# Patient Record
Sex: Male | Born: 1950 | Race: White | Hispanic: No | Smoking: Former smoker
Health system: Southern US, Community
[De-identification: ages and names within clinical notes are randomized; demographics above are authoritative.]

## PROBLEM LIST (undated history)

## (undated) DIAGNOSIS — M1990 Unspecified osteoarthritis, unspecified site: Secondary | ICD-10-CM

## (undated) DIAGNOSIS — K219 Gastro-esophageal reflux disease without esophagitis: Secondary | ICD-10-CM

## (undated) DIAGNOSIS — J302 Other seasonal allergic rhinitis: Secondary | ICD-10-CM

## (undated) DIAGNOSIS — F1910 Other psychoactive substance abuse, uncomplicated: Secondary | ICD-10-CM

## (undated) DIAGNOSIS — T7840XA Allergy, unspecified, initial encounter: Secondary | ICD-10-CM

## (undated) DIAGNOSIS — J449 Chronic obstructive pulmonary disease, unspecified: Secondary | ICD-10-CM

## (undated) DIAGNOSIS — L739 Follicular disorder, unspecified: Secondary | ICD-10-CM

## (undated) DIAGNOSIS — F329 Major depressive disorder, single episode, unspecified: Secondary | ICD-10-CM

## (undated) DIAGNOSIS — F32A Depression, unspecified: Secondary | ICD-10-CM

## (undated) HISTORY — PX: FINGER SURGERY: SHX640

## (undated) HISTORY — DX: Chronic obstructive pulmonary disease, unspecified: J44.9

## (undated) HISTORY — PX: VASECTOMY: SHX75

## (undated) HISTORY — DX: Gastro-esophageal reflux disease without esophagitis: K21.9

## (undated) HISTORY — PX: SINUSOTOMY: SHX291

## (undated) HISTORY — DX: Other seasonal allergic rhinitis: J30.2

## (undated) HISTORY — DX: Follicular disorder, unspecified: L73.9

## (undated) HISTORY — PX: POLYPECTOMY: SHX149

## (undated) HISTORY — DX: Depression, unspecified: F32.A

## (undated) HISTORY — DX: Unspecified osteoarthritis, unspecified site: M19.90

## (undated) HISTORY — DX: Allergy, unspecified, initial encounter: T78.40XA

## (undated) HISTORY — PX: COLONOSCOPY: SHX174

## (undated) HISTORY — PX: TONSILLECTOMY: SUR1361

## (undated) HISTORY — DX: Other psychoactive substance abuse, uncomplicated: F19.10

## (undated) HISTORY — DX: Major depressive disorder, single episode, unspecified: F32.9

---

## 1998-04-06 ENCOUNTER — Emergency Department (HOSPITAL_COMMUNITY): Admission: EM | Admit: 1998-04-06 | Discharge: 1998-04-06 | Payer: Self-pay | Admitting: Emergency Medicine

## 1999-03-31 ENCOUNTER — Emergency Department (HOSPITAL_COMMUNITY): Admission: EM | Admit: 1999-03-31 | Discharge: 1999-03-31 | Payer: Self-pay | Admitting: Emergency Medicine

## 1999-03-31 ENCOUNTER — Encounter: Payer: Self-pay | Admitting: *Deleted

## 2002-05-01 ENCOUNTER — Encounter: Payer: Self-pay | Admitting: Emergency Medicine

## 2002-05-01 ENCOUNTER — Inpatient Hospital Stay (HOSPITAL_COMMUNITY): Admission: EM | Admit: 2002-05-01 | Discharge: 2002-05-04 | Payer: Self-pay

## 2002-06-28 ENCOUNTER — Ambulatory Visit (HOSPITAL_COMMUNITY): Admission: RE | Admit: 2002-06-28 | Discharge: 2002-06-28 | Payer: Self-pay | Admitting: Neurological Surgery

## 2002-06-28 ENCOUNTER — Encounter: Payer: Self-pay | Admitting: Neurological Surgery

## 2002-07-17 ENCOUNTER — Encounter: Admission: RE | Admit: 2002-07-17 | Discharge: 2002-09-06 | Payer: Self-pay | Admitting: Neurological Surgery

## 2003-06-05 ENCOUNTER — Emergency Department (HOSPITAL_COMMUNITY): Admission: EM | Admit: 2003-06-05 | Discharge: 2003-06-05 | Payer: Self-pay | Admitting: Family Medicine

## 2004-06-21 ENCOUNTER — Inpatient Hospital Stay (HOSPITAL_COMMUNITY): Admission: EM | Admit: 2004-06-21 | Discharge: 2004-06-28 | Payer: Self-pay | Admitting: Emergency Medicine

## 2004-06-21 ENCOUNTER — Ambulatory Visit: Payer: Self-pay | Admitting: Psychiatry

## 2004-06-24 ENCOUNTER — Encounter (INDEPENDENT_AMBULATORY_CARE_PROVIDER_SITE_OTHER): Payer: Self-pay | Admitting: Interventional Cardiology

## 2004-06-26 ENCOUNTER — Ambulatory Visit: Payer: Self-pay | Admitting: Cardiology

## 2006-10-25 IMAGING — CR DG CHEST 1V PORT
1 series · 1 of 1 positions shown · non-contrast
Comparison: none

CLINICAL DATA: chest pain
 PORTABLE CHEST- 1 VIEW:

[view not recorded]
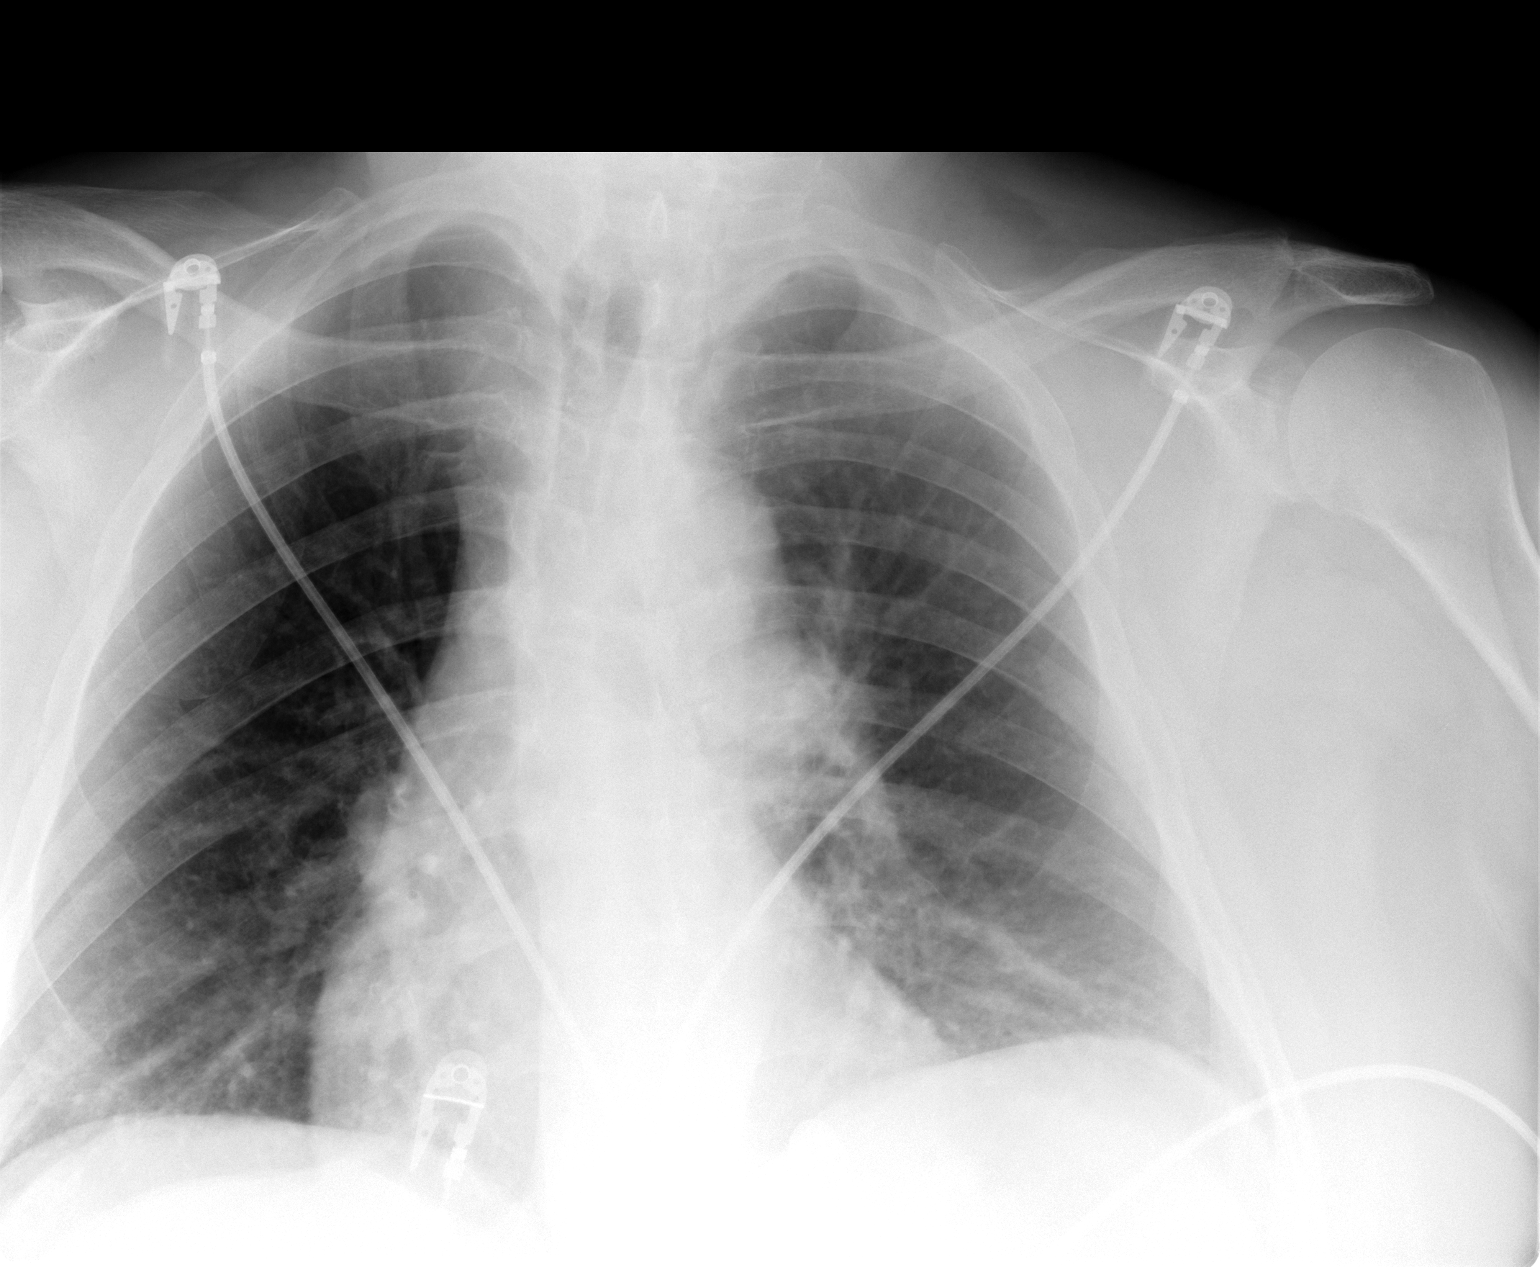

[1 of 1 positions shown; findings below may reference images not displayed]

FINDINGS: No focal lung opacities.  Heart size normal.  No pleural effusions or pneumothorax.
IMPRESSION: No acute cardiopulmonary abnormalities.

## 2008-03-27 ENCOUNTER — Ambulatory Visit (HOSPITAL_COMMUNITY): Admission: RE | Admit: 2008-03-27 | Discharge: 2008-03-27 | Payer: Self-pay | Admitting: Family Medicine

## 2009-08-22 ENCOUNTER — Encounter: Payer: Self-pay | Admitting: Family Medicine

## 2010-02-26 ENCOUNTER — Ambulatory Visit
Admission: RE | Admit: 2010-02-26 | Discharge: 2010-02-26 | Payer: Self-pay | Source: Home / Self Care | Attending: Family Medicine | Admitting: Family Medicine

## 2010-02-26 ENCOUNTER — Other Ambulatory Visit: Payer: Self-pay | Admitting: Family Medicine

## 2010-02-26 DIAGNOSIS — K219 Gastro-esophageal reflux disease without esophagitis: Secondary | ICD-10-CM | POA: Insufficient documentation

## 2010-02-26 DIAGNOSIS — J449 Chronic obstructive pulmonary disease, unspecified: Secondary | ICD-10-CM | POA: Insufficient documentation

## 2010-02-26 DIAGNOSIS — M1990 Unspecified osteoarthritis, unspecified site: Secondary | ICD-10-CM | POA: Insufficient documentation

## 2010-02-26 DIAGNOSIS — J4489 Other specified chronic obstructive pulmonary disease: Secondary | ICD-10-CM | POA: Insufficient documentation

## 2010-02-26 DIAGNOSIS — L738 Other specified follicular disorders: Secondary | ICD-10-CM | POA: Insufficient documentation

## 2010-02-26 LAB — CBC WITH DIFFERENTIAL/PLATELET
Basophils Absolute: 0.1 10*3/uL (ref 0.0–0.1)
Basophils Relative: 0.6 % (ref 0.0–3.0)
Eosinophils Absolute: 0.3 10*3/uL (ref 0.0–0.7)
Eosinophils Relative: 3.6 % (ref 0.0–5.0)
HCT: 41.4 % (ref 39.0–52.0)
Hemoglobin: 14.3 g/dL (ref 13.0–17.0)
Lymphocytes Relative: 28.8 % (ref 12.0–46.0)
Lymphs Abs: 2.8 10*3/uL (ref 0.7–4.0)
MCHC: 34.4 g/dL (ref 30.0–36.0)
MCV: 92.9 fl (ref 78.0–100.0)
Monocytes Absolute: 0.6 10*3/uL (ref 0.1–1.0)
Monocytes Relative: 6 % (ref 3.0–12.0)
Neutro Abs: 5.9 10*3/uL (ref 1.4–7.7)
Neutrophils Relative %: 61 % (ref 43.0–77.0)
Platelets: 257 10*3/uL (ref 150.0–400.0)
RBC: 4.46 Mil/uL (ref 4.22–5.81)
RDW: 12.7 % (ref 11.5–14.6)
WBC: 9.6 10*3/uL (ref 4.5–10.5)

## 2010-02-26 LAB — HEPATIC FUNCTION PANEL
ALT: 30 U/L (ref 0–53)
AST: 24 U/L (ref 0–37)
Albumin: 4.1 g/dL (ref 3.5–5.2)
Alkaline Phosphatase: 88 U/L (ref 39–117)
Bilirubin, Direct: 0.1 mg/dL (ref 0.0–0.3)
Total Bilirubin: 0.4 mg/dL (ref 0.3–1.2)
Total Protein: 7.6 g/dL (ref 6.0–8.3)

## 2010-02-26 LAB — LIPID PANEL
Cholesterol: 141 mg/dL (ref 0–200)
HDL: 25.2 mg/dL — ABNORMAL LOW (ref >39.00)
LDL Cholesterol: 94 mg/dL (ref 0–99)
Total CHOL/HDL Ratio: 6
Triglycerides: 107 mg/dL (ref 0.0–149.0)
VLDL: 21.4 mg/dL (ref 0.0–40.0)

## 2010-02-26 LAB — BASIC METABOLIC PANEL
BUN: 17 mg/dL (ref 6–23)
CO2: 29 mEq/L (ref 19–32)
Calcium: 9.6 mg/dL (ref 8.4–10.5)
Chloride: 104 mEq/L (ref 96–112)
Creatinine, Ser: 1 mg/dL (ref 0.4–1.5)
GFR: 85.71 mL/min (ref >60.00)
Glucose, Bld: 87 mg/dL (ref 70–99)
Potassium: 5 mEq/L (ref 3.5–5.1)
Sodium: 139 mEq/L (ref 135–145)

## 2010-02-26 LAB — CONVERTED CEMR LAB
Blood in Urine, dipstick: NEGATIVE
Ketones, urine, test strip: NEGATIVE
Protein, U semiquant: NEGATIVE
Urobilinogen, UA: 0.2

## 2010-02-26 LAB — TSH: TSH: 1.28 u[IU]/mL (ref 0.35–5.50)

## 2010-02-26 LAB — PSA: PSA: 1.04 ng/mL (ref 0.10–4.00)

## 2010-03-13 NOTE — Assessment & Plan Note (Signed)
Summary: BRAND NEW PT/TO EST/PT REQ MCR CPX/PT COMING IN FASTING/CJR   Vital Signs:  Patient profile:   60 year old male Height:      69.5 inches Weight:      331 pounds BMI:     48.35 O2 Sat:      97 % on Room air Temp:     98.0 degrees F oral Pulse rate:   115 / minute BP sitting:   140 / 80  (left arm) Cuff size:   regular  Vitals Entered By: Duard Brady LPN (February 26, 2010 8:59 AM)  Nutrition Counseling: Patient's BMI is greater than 25 and therefore counseled on weight management options.  O2 Flow:  Room air CC: new to establish Is Patient Diabetic? No   History of Present Illness: New pt to establish care.  Here for Medicare AWV:  1.   Risk factors based on Past M, S, F history:  Hx of GERD, osteoarthritis, ETOH abuse, and reported COPD.  FH mother gastric cancer and brother with esophageal ca.  No know colon cancer. No ETOH since 2008 and currently in ongoing support program.  Former smoker. 2.   Physical Activities: exercise 4-5 days per week. 3.   Depression/mood: No signif depression issues. 4.   Hearing: Hearing OK grossly and to whisper. 5.   ADL's: fully independent. 6.   Fall Risk: low risk 7.   Home Safety: no issues identiifed. 8.   Height, weight, &visual acuity:  Weight stable. Obese all his adult life. 9.   Counseling: needs to lose some weight.   10.   Labs ordered based on risk factors: lipid, hepatic , TSH, PSA, CBC, BMP 11.           Referral Coordination  colonoscopy 12.           Care Plan PVX last year and flu vaccine .  Tdap given.  Set up colonoscopy. 13.            Cognitive Assessment  NO short or long term impairment.  No impairment in judgement.                      Affect and mood are stable.  COPD meds reviewed  .  Past hx PVX and flu vaccine. Followed in [facility] for pulm meds. No recent increase in cough.  No hemoptysis.  GERD stable.  Takes OTC antacids for that.  Osteoarthrits, esp lower extremities.  Takes  Naproxen as needed. ED on Levitra.  That seems to be working well. Skin rash for several weeks.  Erythematous nodules back and face.  ? Occ pustular center. No fever.  Preventive Screening-Counseling & Management  Alcohol-Tobacco     Smoking Status: quit  Allergies (verified): 1)  ! Codeine  Past History:  Family History: Last updated: 02/26/2010 Mother ? gastric cancer Brother esophageal cancer Father alcoholism  Social History: Last updated: 02/26/2010 Divorced ONE CHILD Former Smoker 9/09 past hx ETOH. Hx cocine abuse.  Risk Factors: Smoking Status: quit (02/26/2010)  Past Medical History: Hx Alcoholism none since 2008 Osteoarthritis back COPD GERD  Past Surgical History: Tonsillectomy L sinus surgery polyps 1990s PMH-FH-SH reviewed for relevance  Family History: Mother ? gastric cancer Brother esophageal cancer Father alcoholism  Social History: Divorced ONE CHILD Former Smoker 9/09 past hx ETOH. Hx cocine abuse. Smoking Status:  quit  Review of Systems       The patient complains of weight gain.  The patient denies anorexia, fever, weight loss, vision loss, decreased hearing, hoarseness, chest pain, syncope, peripheral edema, prolonged cough, headaches, hemoptysis, abdominal pain, melena, hematochezia, severe indigestion/heartburn, hematuria, incontinence, muscle weakness, suspicious skin lesions, depression, and enlarged lymph nodes.    Physical Exam  General:  obese in NAD. Head:  normocephalic and atraumatic.   Eyes:  No corneal or conjunctival inflammation noted. EOMI. Perrla. Funduscopic exam benign, without hemorrhages, exudates or papilledema. Vision grossly normal. Ears:  External ear exam shows no significant lesions or deformities.  Otoscopic examination reveals clear canals, tympanic membranes are intact bilaterally without bulging, retraction, inflammation or discharge. Hearing is grossly normal bilaterally. Mouth:  Oral mucosa and  oropharynx without lesions or exudates.  Teeth in good repair. Neck:  No deformities, masses, or tenderness noted. Lungs:  Normal respiratory effort, chest expands symmetrically. Lungs are clear to auscultation, no crackles or wheezes. Heart:  normal rate and regular rhythm.   Abdomen:  soft, non-tender, normal bowel sounds, no masses, no guarding, no abdominal hernia, no hepatomegaly, and no splenomegaly.   Rectal:  No external abnormalities noted. Normal sphincter tone. No rectal masses or tenderness. Genitalia:  small cyst L scrotum-?spermatocele.  no testes masses. Prostate:  Prostate gland firm and smooth, no enlargement, nodularity, tenderness, mass, asymmetry or induration. Msk:  No deformity or scoliosis noted of thoracic or lumbar spine.   Extremities:  No clubbing, cyanosis, edema, or deformity noted with normal full range of motion of all joints.   Neurologic:  alert & oriented X3, cranial nerves II-XII intact, and strength normal in all extremities.   Skin:  L thigh.  erythema 6 by 8 cm area with some central mild induration and no fluctuance. Nontender. Several erythematous nodules upper back and face Cervical Nodes:  No lymphadenopathy noted Psych:  Oriented X3, good eye contact, not anxious appearing, and not depressed appearing.     Impression & Recommendations:  Problem # 1:  ROUTINE GENERAL MEDICAL EXAM@HEALTH  CARE FACL (ICD-V70.0) discussed need for weight loss.  Screening labs.  Tdap given.  Colonoscopy. Orders: UA Dipstick w/o Micro (automated)  (81003) Specimen Handling (21308) Venipuncture (65784) Gastroenterology Referral (GI) TLB-Lipid Panel (80061-LIPID) TLB-BMP (Basic Metabolic Panel-BMET) (80048-METABOL) TLB-CBC Platelet - w/Differential (85025-CBCD) TLB-Hepatic/Liver Function Pnl (80076-HEPATIC) TLB-TSH (Thyroid Stimulating Hormone) (84443-TSH) TLB-PSA (Prostate Specific Antigen) (84153-PSA) Medicare -1st Annual Wellness Visit [phone])  Problem # 2:   OSTEOARTHRITIS, MODERATE (ICD-715.90)  His updated medication list for this problem includes:    Aspir-low 81 Mg Tbec (Aspirin) ..... Qd    Naproxen 250 Mg Tabs (Naproxen) .Marland Kitchen... 2-3 qd  Problem # 3:  GERD (ICD-530.81)  His updated medication list for this problem includes:    Prilosec 20 Mg Cpdr (Omeprazole) .Marland Kitchen... Prn  Problem # 4:  COPD (ICD-496)  His updated medication list for this problem includes:    Advair Diskus 250-50 Mcg/dose Aepb (Fluticasone-salmeterol) .Marland Kitchen... 1 puffs bid    Combivent 18-103 Mcg/act Aero (Ipratropium-albuterol) .Marland Kitchen... 2 puffs two times a day and prn  Problem # 5:  FOLLICULITIS (ICD-704.8) doxycycline  Problem # 6:  CELLULITIS, THIGH (ICD-682.6)  His updated medication list for this problem includes:    Doxycycline Hyclate 100 Mg Caps (Doxycycline hyclate) ..... One by mouth two times a day for 10 days  Complete Medication List: 1)  Advair Diskus 250-50 Mcg/dose Aepb (Fluticasone-salmeterol) .Marland Kitchen.. 1 puffs bid 2)  Combivent 18-103 Mcg/act Aero (Ipratropium-albuterol) .... 2 puffs two times a day and prn 3)  Glucosamine-chondroitin 500-400 Mg Caps (Glucosamine-chondroitin) .... 2 by  mouth qd 4)  Multivitamins Tabs (Multiple vitamin) .... Qd 5)  Aspir-low 81 Mg Tbec (Aspirin) .... Qd 6)  Prilosec 20 Mg Cpdr (Omeprazole) .... Prn 7)  Naproxen 250 Mg Tabs (Naproxen) .... 2-3 qd 8)  Levitra 20 Mg Tabs (Vardenafil hcl) .... As directed 9)  Doxycycline Hyclate 100 Mg Caps (Doxycycline hyclate) .... One by mouth two times a day for 10 days  Other Orders: Tdap => 65yrs IM (32355) Admin 1st Vaccine (73220)  Patient Instructions: 1)  It is important that you exercise reguarly at least 20 minutes 5 times a week. If you develop chest pain, have severe difficulty breathing, or feel very tired, stop exercising immediately and seek medical attention.  2)  You need to lose weight. Consider a lower calorie diet and regular exercise.  3)  We will call you regarding  colonoscopy referral. 4)  Please schedule a follow-up appointment in 1 month.  Prescriptions: DOXYCYCLINE HYCLATE 100 MG CAPS (DOXYCYCLINE HYCLATE) one by mouth two times a day for 10 days  #20 x 0   Entered and Authorized by:   Evelena Peat MD   Signed by:   Evelena Peat MD on 02/26/2010   Method used:   Print then Give to Patient   RxID:   [id number]    Orders Added: 1)  UA Dipstick w/o Micro (automated)  [81003] 2)  Specimen Handling [99000] 3)  Venipuncture [17616] 4)  Tdap => 36yrs IM [90715] 5)  Admin 1st Vaccine [90471] 6)  Gastroenterology Referral [GI] 7)  TLB-Lipid Panel [80061-LIPID] 8)  TLB-BMP (Basic Metabolic Panel-BMET) [80048-METABOL] 9)  TLB-CBC Platelet - w/Differential [85025-CBCD] 10)  TLB-Hepatic/Liver Function Pnl [80076-HEPATIC] 11)  TLB-TSH (Thyroid Stimulating Hormone) [84443-TSH] 12)  TLB-PSA (Prostate Specific Antigen) [07371-GGY] 13)  Medicare -1st Annual Wellness Visit [G0438] 14)  New Patient Level III [69485]   Immunizations Administered:  Tetanus Vaccine:    Vaccine Type: Tdap    Site: left deltoid    Mfr: GlaxoSmithKline    Dose: 0.5 ml    Route: IM    Given by: Duard Brady LPN    Exp. Date: 11/29/2011    Lot #: [id number]    VIS given: 12/28/07 version given February 26, 2010.    Physician counseled: yes   Immunizations Administered:  Tetanus Vaccine:    Vaccine Type: Tdap    Site: left deltoid    Mfr: GlaxoSmithKline    Dose: 0.5 ml    Route: IM    Given by: Duard Brady LPN    Exp. Date: 11/29/2011    Lot #: [id number]    VIS given: 12/28/07 version given February 26, 2010.    Physician counseled: yes   Laboratory Results   Urine Tests  Date/Time Recieved: February 26, 2010 2:55 PM  Date/Time Reported: February 26, 2010 2:55 PM   Routine Urinalysis   Color: yellow Appearance: Clear Glucose: negative   (Normal Range: Negative) Bilirubin: negative   (Normal Range: Negative) Ketone: negative    (Normal Range: Negative) Spec. Gravity: 1.015   (Normal Range: 1.003-1.035) Blood: negative   (Normal Range: Negative) pH: 6.5   (Normal Range: 5.0-8.0) Protein: negative   (Normal Range: Negative) Urobilinogen: 0.2   (Normal Range: 0-1) Nitrite: negative   (Normal Range: Negative) Leukocyte Esterace: negative   (Normal Range: Negative)    Comments: Wynona Canes, CMA  February 26, 2010 2:55 PM

## 2010-03-14 NOTE — Letter (Signed)
Summary: Records from [facility] Department 2009 - 2011  Records from [facility] Department 2009 - 2011   Imported By: Maryln Gottron 12/24/2009 15:18:07  _____________________________________________________________________  External Attachment:    Type:   Image     Comment:   External Document

## 2010-04-17 ENCOUNTER — Other Ambulatory Visit: Payer: Self-pay | Admitting: Internal Medicine

## 2010-04-25 ENCOUNTER — Encounter: Payer: Self-pay | Admitting: *Deleted

## 2010-04-28 ENCOUNTER — Encounter: Payer: Self-pay | Admitting: Internal Medicine

## 2010-05-01 ENCOUNTER — Other Ambulatory Visit: Payer: Self-pay | Admitting: Internal Medicine

## 2010-05-08 NOTE — Miscellaneous (Signed)
Summary: [facility]  Clinical Lists Changes  Medications: Added new medication of MOVIPREP 100 GM  SOLR (PEG-KCL-NACL-NASULF-NA ASC-C) As per prep instructions. - Signed Rx of MOVIPREP 100 GM  SOLR (PEG-KCL-NACL-NASULF-NA ASC-C) As per prep instructions.;  #1 x 0;  Signed;  Entered by: Ezra Sites RN;  Authorized by: Iva Boop MD, [facility];  Method used: Electronically to [facility]*, [address] [place], [place]  [address], Ph: [phone], Fax: Allergies: Changed allergy or adverse reaction from CODEINE to CODEINE    Prescriptions: MOVIPREP 100 GM  SOLR (PEG-KCL-NACL-NASULF-NA ASC-C) As per prep instructions.  #1 x 0   Entered by:   Ezra Sites RN   Authorized by:   Iva Boop MD, [facility]   Signed by:   Ezra Sites RN on 04/28/2010   Method used:   Electronically to        [facility]* (retail)       [address]       [place], [place]  [address]       Ph: [phone]       Fax:    RxID:   [id number]

## 2010-05-08 NOTE — Letter (Signed)
Summary: [facility] Instructions  [facility]  [address] [place], [place] [address]   Phone: [phone]  Fax: [phone]       Edgar Perez    November 24, 1950    MRN: [id number]        Procedure Day /Date:  Monday 05/12/2010     Arrival Time: 2:00 pm     Procedure Time: 3:00 pm     Location of Procedure:                    _ x_  [facility] (4th Floor)   PREPARATION FOR COLONOSCOPY WITH MOVIPREP   Starting 5 days prior to your procedure Wednesday 3/28 do not eat nuts, seeds, popcorn, corn, beans, peas,  salads, or any raw vegetables.  Do not take any fiber supplements (e.g. Metamucil, Citrucel, and Benefiber).  THE DAY BEFORE YOUR PROCEDURE         DATE: Sunday 4/1  1.  Drink clear liquids the entire day-NO SOLID FOOD  2.  Do not drink anything colored red or purple.  Avoid juices with pulp.  No orange juice.  3.  Drink at least 64 oz. (8 glasses) of fluid/clear liquids during the day to prevent dehydration and help the prep work efficiently.  CLEAR LIQUIDS INCLUDE: Water Jello Ice Popsicles Tea (sugar ok, no milk/cream) Powdered fruit flavored drinks Coffee (sugar ok, no milk/cream) Gatorade Juice: apple, white grape, white cranberry  Lemonade Clear bullion, consomm, broth Carbonated beverages (any kind) Strained chicken noodle soup Hard Candy                             4.  In the morning, mix first dose of MoviPrep solution:    Empty 1 Pouch A and 1 Pouch B into the disposable container    Add lukewarm drinking water to the top line of the container. Mix to dissolve    Refrigerate (mixed solution should be used within 24 hrs)  5.  Begin drinking the prep at 5:00 p.m. The MoviPrep container is divided by 4 marks.   Every 15 minutes drink the solution down to the next mark (approximately 8 oz) until the full liter is complete.   6.  Follow completed prep with 16 oz of clear liquid of your choice (Nothing red or purple).   Continue to drink clear liquids until bedtime.  7.  Before going to bed, mix second dose of MoviPrep solution:    Empty 1 Pouch A and 1 Pouch B into the disposable container    Add lukewarm drinking water to the top line of the container. Mix to dissolve    Refrigerate  THE DAY OF YOUR PROCEDURE      DATE: Monday 4/2  Beginning at 10:00 a.m. (5 hours before procedure):         1. Every 15 minutes, drink the solution down to the next mark (approx 8 oz) until the full liter is complete.  2. Follow completed prep with 16 oz. of clear liquid of your choice.    3. You may drink clear liquids until 1:00 pm (2 HOURS BEFORE PROCEDURE).   MEDICATION INSTRUCTIONS  Unless otherwise instructed, you should take regular prescription medications with a small sip of water   as early as possible the morning of your procedure.           OTHER INSTRUCTIONS  You will need a responsible adult at  least 58 years of age to accompany you and drive you home.   This person must remain in the waiting room during your procedure.  Wear loose fitting clothing that is easily removed.  Leave jewelry and other valuables at home.  However, you may wish to bring a book to read or  an iPod/MP3 player to listen to music as you wait for your procedure to start.  Remove all body piercing jewelry and leave at home.  Total time from sign-in until discharge is approximately 2-3 hours.  You should go home directly after your procedure and rest.  You can resume normal activities the  day after your procedure.  The day of your procedure you should not:   Drive   Make legal decisions   Operate machinery   Drink alcohol   Return to work  You will receive specific instructions about eating, activities and medications before you leave.    The above instructions have been reviewed and explained to me by  Ezra Sites RN  April 28, 2010 1:51 PM      I fully understand and can verbalize these  instructions _____________________________ Date _________

## 2010-05-12 ENCOUNTER — Ambulatory Visit (AMBULATORY_SURGERY_CENTER): Payer: Medicare Other | Admitting: Internal Medicine

## 2010-05-12 ENCOUNTER — Encounter: Payer: Self-pay | Admitting: Internal Medicine

## 2010-05-12 VITALS — BP 120/73 | HR 80 | Temp 99.0°F | Resp 16 | Ht 71.0 in | Wt 330.0 lb

## 2010-05-12 DIAGNOSIS — Z1211 Encounter for screening for malignant neoplasm of colon: Secondary | ICD-10-CM

## 2010-05-12 DIAGNOSIS — K620 Anal polyp: Secondary | ICD-10-CM

## 2010-05-12 DIAGNOSIS — D126 Benign neoplasm of colon, unspecified: Secondary | ICD-10-CM

## 2010-05-12 DIAGNOSIS — K5730 Diverticulosis of large intestine without perforation or abscess without bleeding: Secondary | ICD-10-CM

## 2010-05-12 MED ORDER — ASPIRIN 81 MG PO TABS: 81.0000 mg | ORAL_TABLET | Freq: Every day | ORAL | Status: AC

## 2010-05-12 MED ORDER — NAPROXEN 250 MG PO TABS: 250.0000 mg | ORAL_TABLET | Freq: Every day | ORAL | Status: DC

## 2010-05-12 NOTE — Patient Instructions (Addendum)
Do not use your aspirin or Naproxen or other ant-inflammatories or aspirin medications until 05/26/2010. See medication list below also. Handouts for polyps, hemorrhoids, diverticulosis, high fiber diet given.

## 2010-05-13 ENCOUNTER — Telehealth: Payer: Self-pay | Admitting: *Deleted

## 2010-05-13 NOTE — Telephone Encounter (Signed)
No name on answering machine, no message left

## 2010-05-14 ENCOUNTER — Observation Stay (HOSPITAL_COMMUNITY)
Admission: EM | Admit: 2010-05-14 | Discharge: 2010-05-16 | Disposition: A | Discharge status: Home / Self Care | Payer: Medicare Other | Attending: Gastroenterology | Admitting: Gastroenterology

## 2010-05-14 ENCOUNTER — Observation Stay (HOSPITAL_COMMUNITY): Admission: AD | Admit: 2010-05-14 | Payer: Medicare Other | Source: Ambulatory Visit | Admitting: Gastroenterology

## 2010-05-14 ENCOUNTER — Telehealth: Payer: Self-pay | Admitting: Internal Medicine

## 2010-05-14 ENCOUNTER — Encounter: Payer: Self-pay | Admitting: *Deleted

## 2010-05-14 DIAGNOSIS — IMO0002 Reserved for concepts with insufficient information to code with codable children: Secondary | ICD-10-CM | POA: Insufficient documentation

## 2010-05-14 DIAGNOSIS — K5730 Diverticulosis of large intestine without perforation or abscess without bleeding: Secondary | ICD-10-CM | POA: Insufficient documentation

## 2010-05-14 DIAGNOSIS — D500 Iron deficiency anemia secondary to blood loss (chronic): Secondary | ICD-10-CM | POA: Insufficient documentation

## 2010-05-14 DIAGNOSIS — Z79899 Other long term (current) drug therapy: Secondary | ICD-10-CM | POA: Insufficient documentation

## 2010-05-14 DIAGNOSIS — Y849 Medical procedure, unspecified as the cause of abnormal reaction of the patient, or of later complication, without mention of misadventure at the time of the procedure: Secondary | ICD-10-CM | POA: Insufficient documentation

## 2010-05-14 DIAGNOSIS — K219 Gastro-esophageal reflux disease without esophagitis: Secondary | ICD-10-CM | POA: Insufficient documentation

## 2010-05-14 DIAGNOSIS — Z7982 Long term (current) use of aspirin: Secondary | ICD-10-CM | POA: Insufficient documentation

## 2010-05-14 DIAGNOSIS — J4489 Other specified chronic obstructive pulmonary disease: Secondary | ICD-10-CM | POA: Insufficient documentation

## 2010-05-14 DIAGNOSIS — K633 Ulcer of intestine: Principal | ICD-10-CM | POA: Insufficient documentation

## 2010-05-14 DIAGNOSIS — D62 Acute posthemorrhagic anemia: Secondary | ICD-10-CM

## 2010-05-14 DIAGNOSIS — M1990 Unspecified osteoarthritis, unspecified site: Secondary | ICD-10-CM | POA: Insufficient documentation

## 2010-05-14 DIAGNOSIS — D126 Benign neoplasm of colon, unspecified: Secondary | ICD-10-CM | POA: Insufficient documentation

## 2010-05-14 DIAGNOSIS — Z01812 Encounter for preprocedural laboratory examination: Secondary | ICD-10-CM | POA: Insufficient documentation

## 2010-05-14 DIAGNOSIS — K921 Melena: Secondary | ICD-10-CM

## 2010-05-14 DIAGNOSIS — J449 Chronic obstructive pulmonary disease, unspecified: Secondary | ICD-10-CM | POA: Insufficient documentation

## 2010-05-14 LAB — PROTIME-INR
INR: 1.12 (ref 0.00–1.49)
Prothrombin Time: 14.6 seconds (ref 11.6–15.2)

## 2010-05-14 LAB — BASIC METABOLIC PANEL
BUN: 14 mg/dL (ref 6–23)
Chloride: 105 mEq/L (ref 96–112)
GFR calc Af Amer: >60 mL/min (ref >60)
GFR calc non Af Amer: >60 mL/min (ref >60)
Potassium: 3.9 mEq/L (ref 3.5–5.1)
Sodium: 135 mEq/L (ref 135–145)

## 2010-05-14 LAB — DIFFERENTIAL
Basophils Relative: 1 % (ref 0–1)
Eosinophils Relative: 4 % (ref 0–5)
Lymphs Abs: 2.7 10*3/uL (ref 0.7–4.0)
Monocytes Absolute: 0.6 10*3/uL (ref 0.1–1.0)
Neutro Abs: 4.3 10*3/uL (ref 1.7–7.7)

## 2010-05-14 LAB — CBC
Hemoglobin: 9.8 g/dL — ABNORMAL LOW (ref 13.0–17.0)
RDW: 12.8 % (ref 11.5–15.5)
WBC: 7.9 10*3/uL (ref 4.0–10.5)

## 2010-05-14 NOTE — Telephone Encounter (Signed)
Called [facility] ED it confirm Patient in ED.   He was in triage having bloodwork done while waiting for room in ED.  Hospital MD to see patient per Dr. Leone Payor.  Called Amy [place], daughter, and reported this to her.  She asked that I leave her name and number with the [facility] ED desk.  This has been done.

## 2010-05-14 NOTE — Telephone Encounter (Signed)
Pt states that he had a colon Monday and he has been having liquid, bloody stools. States that they have been dark, deep wine color. His last BM at 6:45am was bright red. Spoke with Dr. Leone Payor and per Dr. Leone Payor pt needs to go to the ER to be evaluated. Amy [place] PA aware and knows pt is on the way to [facility] ER.

## 2010-05-14 NOTE — Telephone Encounter (Signed)
This encounter was created in error - please disregard.

## 2010-05-15 DIAGNOSIS — Z86010 Personal history of colonic polyps: Secondary | ICD-10-CM

## 2010-05-15 DIAGNOSIS — K5730 Diverticulosis of large intestine without perforation or abscess without bleeding: Secondary | ICD-10-CM

## 2010-05-15 DIAGNOSIS — K633 Ulcer of intestine: Secondary | ICD-10-CM

## 2010-05-15 LAB — HEMOGLOBIN AND HEMATOCRIT, BLOOD
HCT: 26.3 % — ABNORMAL LOW (ref 39.0–52.0)
HCT: 28.8 % — ABNORMAL LOW (ref 39.0–52.0)
Hemoglobin: 8.9 g/dL — ABNORMAL LOW (ref 13.0–17.0)
Hemoglobin: 9.3 g/dL — ABNORMAL LOW (ref 13.0–17.0)
Hemoglobin: 9.5 g/dL — ABNORMAL LOW (ref 13.0–17.0)

## 2010-05-15 LAB — CBC
Platelets: 158 10*3/uL (ref 150–400)
RDW: 12.8 % (ref 11.5–15.5)

## 2010-05-15 LAB — COMPREHENSIVE METABOLIC PANEL
Alkaline Phosphatase: 61 U/L (ref 39–117)
CO2: 27 mEq/L (ref 19–32)
Chloride: 109 mEq/L (ref 96–112)
GFR calc Af Amer: >60 mL/min (ref >60)
GFR calc non Af Amer: >60 mL/min (ref >60)
Potassium: 4.2 mEq/L (ref 3.5–5.1)
Sodium: 141 mEq/L (ref 135–145)
Total Bilirubin: 0.5 mg/dL (ref 0.3–1.2)
Total Protein: 5.9 g/dL — ABNORMAL LOW (ref 6.0–8.3)

## 2010-05-15 LAB — MRSA PCR SCREENING: MRSA by PCR: NEGATIVE

## 2010-05-15 LAB — ABO/RH: ABO/RH(D): O POS

## 2010-05-16 LAB — CROSSMATCH
ABO/RH(D): O POS
Antibody Screen: NEGATIVE
Unit division: 0
Unit division: 0

## 2010-05-19 ENCOUNTER — Telehealth: Payer: Self-pay | Admitting: Family Medicine

## 2010-05-19 ENCOUNTER — Telehealth: Payer: Self-pay

## 2010-05-19 ENCOUNTER — Other Ambulatory Visit (INDEPENDENT_AMBULATORY_CARE_PROVIDER_SITE_OTHER): Payer: Medicare Other

## 2010-05-19 DIAGNOSIS — K625 Hemorrhage of anus and rectum: Secondary | ICD-10-CM

## 2010-05-19 LAB — CBC WITH DIFFERENTIAL/PLATELET
Basophils Absolute: 0 10*3/uL (ref 0.0–0.1)
Eosinophils Absolute: 0.2 10*3/uL (ref 0.0–0.7)
Hemoglobin: 9.7 g/dL — ABNORMAL LOW (ref 13.0–17.0)
Lymphocytes Relative: 28.8 % (ref 12.0–46.0)
Lymphs Abs: 2.1 10*3/uL (ref 0.7–4.0)
MCHC: 35.1 g/dL (ref 30.0–36.0)
Neutro Abs: 4.4 10*3/uL (ref 1.4–7.7)
Platelets: 225 10*3/uL (ref 150.0–400.0)
RDW: 13.6 % (ref 11.5–14.6)

## 2010-05-19 NOTE — Telephone Encounter (Signed)
Pt informed and will have him come in for fasting labs and OV

## 2010-05-19 NOTE — Telephone Encounter (Signed)
Please advise 

## 2010-05-19 NOTE — Telephone Encounter (Signed)
Patient was discharged from the hospital for a post polypectomy bleed on 05/15/10.  He is c/o HA, dizziness, light headed and has to steady himself after standing up.  He denies any more bloody stool, abdominal pain, fever, nausea or vomiting.  He is tolerating a soft diet and his bowel habits have returned to normal.  He does report black stool, but was also started on iron last week.

## 2010-05-19 NOTE — Telephone Encounter (Signed)
I spoke with Dr Arlyce Dice patient will come in for a stat CBC today.  Patient is aware and will come this am for labs.  Patient also requests to change to Dr Arlyce Dice, "I felt very comfortable with him while I was in the hospital".  Dr Leone Payor do you approve of the change

## 2010-05-19 NOTE — Telephone Encounter (Signed)
Pt called and colonoscopy done by [facility]. Pt has some complicatons with bleeding after polyp removal. Pt ended up having to go to ER and was told that he was a diabetic while pt was in hospital, and to sch a fup with Dr Caryl Never and get labs drawn for bmet (to check sugars) and cbc.  Pt is req that Dr Caryl Never review his chart and advise.

## 2010-05-19 NOTE — Telephone Encounter (Signed)
I suggest follow up and we will plan to get fasting glucose here.

## 2010-05-19 NOTE — Progress Notes (Signed)
Patient advised of Dr Marzetta Board recommendations.

## 2010-05-20 ENCOUNTER — Ambulatory Visit (INDEPENDENT_AMBULATORY_CARE_PROVIDER_SITE_OTHER): Payer: Medicare Other | Admitting: Family Medicine

## 2010-05-20 ENCOUNTER — Encounter: Payer: Self-pay | Admitting: Family Medicine

## 2010-05-20 VITALS — BP 140/78 | HR 72 | Temp 98.6°F | Resp 12 | Ht 69.5 in | Wt 326.0 lb

## 2010-05-20 DIAGNOSIS — M545 Low back pain: Secondary | ICD-10-CM

## 2010-05-20 DIAGNOSIS — R42 Dizziness and giddiness: Secondary | ICD-10-CM

## 2010-05-20 DIAGNOSIS — D649 Anemia, unspecified: Secondary | ICD-10-CM

## 2010-05-20 DIAGNOSIS — R7309 Other abnormal glucose: Secondary | ICD-10-CM

## 2010-05-20 MED ORDER — TRAMADOL HCL 50 MG PO TABS: 50.0000 mg | ORAL_TABLET | Freq: Four times a day (QID) | ORAL | Status: AC | PRN

## 2010-05-20 NOTE — Progress Notes (Signed)
  Subjective:    Patient ID: Edgar Perez, male    DOB: 08/08/1950, 60 y.o.   MRN: [id number]  HPI Patient seen for followup. Recent colonoscopy April 2. Had several polyps removed. Had complication of some ongoing bleeding and was readmitted and had a repeat colonoscopy performed. Hemoglobin has remained stable in the 9 range. Just had repeat hemoglobin yesterday. Is here today regarding elevated blood sugar. Back in January when he had complete physical had fasting blood sugar of 87. Had one reading of 161 in hospital but was receiving some IV fluids (?dextrose containing) and also not clear if this was a fasting blood sugar. He has no history of diabetes. Prior to his recent colonoscopy exercising fairly regularly.  He does have some lightheadedness, dizziness, and occasional headaches since his colonoscopy. We explained this may be related to his anemia. He has not had any syncope. Good fluid intake. Appetite is fair. No further hematochezia  He has some chronic low back pain and has taken Naproxen in past.  We have recommended no NSAIDS at this time and wrote for limited Tramadol to use for back pain.  Review of Systems  Constitutional: Negative for fever, chills, appetite change and unexpected weight change.  Respiratory: Negative for cough and shortness of breath.   Cardiovascular: Negative for chest pain, palpitations and leg swelling.  Gastrointestinal: Negative for nausea, vomiting, abdominal pain, diarrhea and constipation.  Genitourinary: Negative for dysuria.  Neurological: Positive for dizziness and headaches. Negative for seizures, syncope and weakness.  Psychiatric/Behavioral: Negative for dysphoric mood.       Objective:   Physical Exam  Constitutional: He is oriented to person, place, and time. He appears well-developed and well-nourished. No distress.  HENT:  Mouth/Throat: Oropharynx is clear and moist. No oropharyngeal exudate.  Eyes: Pupils are equal, round, and  reactive to light.  Cardiovascular: Normal rate, regular rhythm and normal heart sounds.   No murmur heard. Pulmonary/Chest: Effort normal and breath sounds normal. No respiratory distress. He has no wheezes. He has no rales.  Abdominal: Soft. There is no tenderness. There is no rebound and no guarding.  Musculoskeletal: He exhibits no edema.  Neurological: He is alert and oriented to person, place, and time. No cranial nerve deficit.          Assessment & Plan:  #1 elevated blood sugar during recent hospitalization. Reassess fasting blood sugar today #2 recent hematochezia following polypectomy with anemia which is stable #3 mild dizziness probably related to anemia.

## 2010-05-20 NOTE — Telephone Encounter (Signed)
That is ok with me Let him know that polyps benign He will need a follow up colon in several months per dr Arlyce Dice to recheck and remove residual cecal polyp No letter  i do not think i received his path in myinbox but found it!

## 2010-05-20 NOTE — Patient Instructions (Signed)
Continue with plenty of fluids and iron supplementation. Continue to work on weight loss.

## 2010-05-21 NOTE — Telephone Encounter (Signed)
Blood count is stable.  I would prefer that he follow with Dr. Leone Payor. If not, I am only too except in.

## 2010-05-21 NOTE — Telephone Encounter (Signed)
Recall entered for 11/2010 for Dr Arlyce Dice as pt prefers to switch to Dr Arlyce Dice. He is notified of recall date.

## 2010-05-21 NOTE — Telephone Encounter (Signed)
Patient advised of all the above.     Dr Arlyce Dice when do you want me to place the recall for?

## 2010-05-21 NOTE — Telephone Encounter (Signed)
Followup colonoscopy with possible ERBE in 6 months

## 2010-05-23 NOTE — Progress Notes (Signed)
Quick Note:  As per other documentation patient informed and to have repeat colonoscopy with Dr. Arlyce Dice Oct 2012 ______

## 2010-05-26 ENCOUNTER — Ambulatory Visit: Payer: Medicare Other | Admitting: Family Medicine

## 2010-06-11 NOTE — Discharge Summary (Signed)
Edgar Perez, Edgar Perez              ACCOUNT NO.:  [id number]  MEDICAL RECORD NO.:  [id number]           PATIENT TYPE:  O  LOCATION:  1304                         FACILITY:  [facility]  PHYSICIAN:  Barbette Hair. Arlyce Dice, MD,FACGDATE OF BIRTH:  12/10/1950  DATE OF ADMISSION:  05/14/2010 DATE OF DISCHARGE:  05/16/2010                              DISCHARGE SUMMARY   ADMITTING DIAGNOSES: 1. Lower gastrointestinal bleed.  Suspect bleeding from recent     polypectomy site in the colon. 2. Colonoscopy on May 12, 2010 at [facility].  Five     polyps were removed; one larger polyp in the cecum was piecemeal     removed with snare cautery.  Additional polyps removed from the     ascending, sigmoid, and rectum.  Severe diverticulosis also noted     within the sigmoid colon. 3. Obesity. 4. Chronic obstructive pulmonary disease. 5. Osteoarthritis.  Naprosyn has been on     hold since before the colonoscopy.  DISCHARGE DIAGNOSES: 1. Post polypectomy bleed.  Status post repeat colonoscopy May 15, 2010.  Ulcers found at sites of polypectomies in     the cecum and descending colon.  The cecal post polypectomy ulcer     was treated with injection of epinephrine.  No active bleeding was     seen during the colonoscopy. There was only a scant amount of blood     seen following the injection of epinephrine. 2. Acute blood loss anemia.  Hemoglobin nadir of 8.6 on May 15, 2010.     Hemoglobin 9.6 at discharge.  Baseline hemoglobin from February 26, 2010 is 14.3. 3. Hyperglycemia with a blood sugar reading of 161 on May 15, 2010.     Note that serum glucose was normal on routine labs on February 26, 2010. 4. Obesity. 5. Osteoarthritis.  Again, Naprosyn was on hold prior to and following     his 60 first colonoscopy and also plan to hold Naprosyn for another     couple of weeks after this discharge.  PROCEDURES:  Colonoscopy by Dr. Arlyce Dice.  Description above.  CONSULTATIONS:   None.  BRIEF HISTORY:  Edgar Perez is a 60 year old gentleman who had a screening colonoscopy by Dr. Leone Payor on May 12, 2010.  Five polyps in total were removed; the largest of these located in the cecum was 15-20 mm and was removed piecemeal with snare and cautery.  The patient started passing dark blood the evening following the colonoscopy.  The [facility] called the patient but did not leave a message. The patient did not call [facility] to report these bloody black liquid stools and they persisted Tuesday and Wednesday.  By Wednesday morning the blood looked a little bit more bright red and he did call the office that afternoon and was sent to the emergency room for evaluation.  He was not having much in the way of abdominal pain.  He just felt bloated.  He had mild dizziness with walking. He had kept both his aspirin and Naprosyn on hold pre and post polypectomy.  In the emergency room the patient was evaluated and admitted for a presumed post polypectomy bleed to the [facility] GI service, Dr. Melvia Heaps attending.  LABORATORY DATA:  The admission CBC with a hemoglobin of 9.8, hematocrit of 29.3, MCV of 90.2, platelets of 188 and white blood cell count of 7.9.  Hemoglobin, hematocrit nadir of 8.6 and 26 on May 15, 2010.  On May 16, 2010, the morning of discharge, the hemoglobin was 9.6 and hematocrit was 28.6.  PT 14.6, INR 1.12.  Sodium 141, potassium 4.2. Chloride 109, glucose 27.  BUN 10, creatinine 0.8.  Glucose reading range was 87-161.  Albumin was 3.1.  Total protein 5.9, calcium 8.2. Transaminases, alkaline phosphatase and total bilirubin were within normal limits.  HOSPITAL COURSE:  The patient was admitted to [facility] for close observation and serial CBCs.  His hemoglobin did drift, associated with ongoing lower GI bleeding.  However, he did not require any transfusions following that low hemoglobin of 8.6.  It steadily rose  up into the 9 range.  The patient had bleeding leading up to the colonoscopy prep and during the colonoscopy prep.  However, at the time of the colonoscopy there was no fresh blood in the colon nor was there any active oozing from any of the polypectomy-associated ulcers which were seen on colonoscopy.  One polypectomy site at the cecum was injected.  The patient was kept overnight following that colonoscopy.  Diet was advanced.  He was stable for discharge home the morning of May 16, 2010.  He had even had a small ball-like volume of brown stool the morning of May 16, 2010.  He was not feeling dizzy.  He had a fair amount of questions, all of which were answered.  Pathology does not yet have the results of the pathology from the patient's polypectomies.  DISCHARGE DIET:  Regular, although with his increased glucose, he was advised to avoid sweets and sugar-sweetened drinks.  As for followup, within the next 2-4 weeks he should be seen by Dr. Caryl Never to have both a CBC as well as to have a BMET drawn to ascertain whether his anemia has further improved as well as whether or not the hyperglycemia observed as an inpatient is a persistent versus a fleeting problem.  DISCHARGE MEDICATIONS:  Acetaminophen 325 mg 3 tablets by mouth daily, Advair Diskus inhaler 250/50 one puff inhaled twice daily, Combivent inhaler 2-3 puffs inhaled daily, glucosamine 2 tablets by mouth daily, multivitamin 1 tablet daily, ranitidine 75 mg daily.     Jennye Moccasin, PA-C   ______________________________ Barbette Hair. Arlyce Dice, MD,FACG    SG/MEDQ  D:  05/16/2010  T:  05/16/2010  Job:  [id number]  cc:   Evelena Peat, M.D.  Barbette Hair. Arlyce Dice, MD,FACG [address] [place] [address]  Iva Boop, MD,FACG [facility] [address] [place], [place] [address]  Electronically Signed by Jennye Moccasin PA-C on 05/19/2010 01:41:05 PM Electronically Signed by Melvia Heaps MDFACG on  06/11/2010 02:44:17 PM

## 2010-06-11 NOTE — H&P (Signed)
Edgar Perez, Edgar Perez              ACCOUNT NO.:  [id number]  MEDICAL RECORD NO.:  [id number]           PATIENT TYPE:  I  LOCATION:  1304                         FACILITY:  [facility]  PHYSICIAN:  Barbette Hair. Arlyce Dice, MD,FACGDATE OF BIRTH:  [birth year]  DATE OF ADMISSION:  05/14/2010 DATE OF DISCHARGE:                             HISTORY & PHYSICAL   PROBLEMS:  Acute rectal bleeding.  HISTORY:  Edgar Perez is a pleasant 60 year old white male, primary patient of Dr. Caryl Never, who was referred to Dr. Leone Payor for a screening colonoscopy.  He underwent colonoscopy on May 12, 2010 at the [facility] and was found to have 5 polyps.  The largest of these was 15-20 mm located in the cecum and was removed piecemeal with snare and cautery. He also had a second polyp in the ascending colon, approximately 12 mm, which was also removed piecemeal and cautery and then 3 smaller polyps in the left colon removed with cold snare. The patient says he started passing dark blood later that evening which continued through yesterday.  Apparently, he had been called by the nursing staff at the [facility].  He did not pick up and no message was left; however, he waited for a call back which did not happen.  He continued to pass black liquidy stools through Tuesday and then into Wednesday morning.  He felt that he was passing brighter blood by Wednesday morning and called the office.  He was then advised to proceed to the emergency room for evaluation.  He did admit to some dizziness with ambulation intermittently and complained of feeling full and "bloated". He had not had any shortness of breath, chest pain, weakness, etc., and had been out in his yard earlier in the day.  The patient generally takes an aspirin every day and had been on NSAIDs, but did not resume either after the polypectomy.  On further discussion with Dr. Leone Payor, there is concern that the cecal polyp could be malignant and biopsies are pending.  PAST  HISTORY:  Pertinent for COPD, colon polyps as above, GERD, osteoarthritis.  CURRENT MEDICATIONS:  Aspirin 81 mg daily, Levitra p.r.n., naproxen 2 to 3 daily, Prilosec 20 mg daily, Advair Diskus 1 puff b.i.d., and, Combivent 2 puff b.i.d.  ALLERGIES:  CODEINE.  FAMILY HISTORY:  Pertinent for brother with esophageal cancer.  Mother deceased with "stomach" cancer.  Father deceased complications of alcoholism.  SOCIAL HISTORY:  The patient is divorced, currently lives alone.  He has history of EtOH abuse which is inactive and is a nonsmoker.  REVIEW OF SYSTEMS:  CONSTITUTIONAL:  Completely benign. CARDIOVASCULAR:  Denies any chest pain or anginal symptoms. PULMONARY:  Pertinent for chronic dyspnea with exertion.  No cough or sputum production. GASTROINTESTINAL:  As above. GENITOURINARY:  Denies dysuria, urgency, or frequency. MUSCULOSKELETAL:  Pertinent for arthritic symptoms for which he takes the naproxen. NEURO/PSYCH:  Negative, and all other review of systems negative except as outlined in the HPI.  LABORATORY STUDIES ON ADMISSION:  Showed WBC of 7.9, hemoglobin 9.8, hematocrit of 29.8, MCV of 90.2, platelets 188,000.  Protime 14.6, INR of 1.12.  Electrolytes within normal limits.  BUN 14, creatinine 0.86.  X-ray studies, none.  PHYSICAL EXAM:  GENERAL:  Well-developed obese white male, in no acute distress.  He is pleasant, alert and oriented x3. VITAL SIGNS:  He is afebrile on admission, blood pressure 101/71, pulse 110, sat is 97 on room air. HEENT:  Nontraumatic, normocephalic.  EOMI, PERRLA.  Sclerae anicteric. NECK:  Supple.  There is no JVD. CARDIOVASCULAR:  Regular rate and rhythm with S1 and S2.  No murmur, rub, or gallop. PULMONARY:  Clear with slightly decreased breath sounds bilaterally. ABDOMEN:  Obese, soft.  Bowel sounds are active.  There is no focal tenderness.  No mass or hepatosplenomegaly. RECTAL:  Exam was not done. EXTREMITIES:  Without clubbing,  cyanosis, or edema. SKIN:  Benign without significant lesions. NEURO:  Alert and oriented x3 and grossly nonfocal. PSYCH:  Mood and affect appropriate and normal.  IMPRESSION: 35. A 60 year old white male with acute lower gastrointestinal bleed,     status post colonoscopy with polypectomy, May 12, 2010, bleeding     consistent with post polypectomy hemorrhage likely secondary to     large cecal polyp. 2. Chronic obstructive pulmonary disease. 3. Gastroesophageal reflux disease. 4. Osteoarthritis.  PLAN:  The patient is admitted to the service of Dr. Melvia Heaps for IV fluid hydration, bowel rest, serial H and H and transfusions as indicated.  We will plan on bowel prep in the early a.m. and repeat colonoscopy on April 5 for control of bleeding.  Also, we will await path on this polyp which may be malignant.  For details, please see the orders.     Amy Esterwood, PA-C   ______________________________ Barbette Hair Arlyce Dice, MD,FACG    AE/MEDQ  D:  05/15/2010  T:  05/15/2010  Job:  [id number]  Electronically Signed by AMY ESTERWOOD PA-C on 05/29/2010 04:25:48 PM Electronically Signed by Melvia Heaps MDFACG on 06/11/2010 02:44:19 PM
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
## 2010-06-27 NOTE — Discharge Summary (Signed)
NAMEREMER, COUSE              ACCOUNT NO.:  [id number]   MEDICAL RECORD NO.:  [id number]          PATIENT TYPE:  INP   LOCATION:  2018                         FACILITY:  [facility]   PHYSICIAN:  Jackie Plum, M.D.DATE OF BIRTH:  Jul 19, 1950   DATE OF ADMISSION:  06/20/2004  DATE OF DISCHARGE:  06/28/2004                                 DISCHARGE SUMMARY   DISCHARGE DIAGNOSES:  1.  Alcohol withdrawal, resolved.  2.  Chest pain, resolved.      1.  Adenosine Cardiolite done on Jun 26, 2004 was negative for any          ischemia.  3.  Dyslipidemia.  4.  Hypertension.  5.  History of alcohol and cocaine abuse.  6.  Obesity.  7.  Reflux.   MEDICATIONS ON ADMISSION:  1.  Aspirin 81 daily.  2.  Folic acid 1 mg daily.  3.  Lisinopril 20 mg daily.  4.  Multivitamin one tablet daily.  5.  Thiamine 100 mg daily.  6.  Protonix 40 mg daily.  7.  Zocor 20 mg daily.  8.  Nicotine patch 21 mg daily.   ACTIVITY:  As tolerated.   DIET:  Low salt, low fat diet.   DISCHARGE INSTRUCTIONS:  He has been counseled to stop smoking cigarettes,  to report to the M.D. if he experiences any problems.  He is to follow up  with [facility].  He is to call for an appointment to be seen by  Gibson Telleria in two weeks.  He is to go to [facility] as planned for alcohol outpatient  rehabilitation.   DISCHARGE LABORATORIES:  WBC count 8.9, hemoglobin 15.6, hematocrit 45.2,  MCV 98.4, platelet count 234,000.  Sodium 136, potassium 3.5, chloride 101,  CO2 34, glucose 83, BUN 3, creatinine 0.9, bilirubin 1.0, alkaline  phosphatase 94, AST 58, ALT 28, total protein 7.3, albumin 4.0, calcium 8.7.  Hemoglobin A1c 5.4%.  Total cholesterol 115, triglycerides 62, HDL decreased  at 29 mg/dl, LDH 94.   CONSULTANTS:  Dr. Jeanie Sewer of Psychiatry and Charlton Haws, M.D. of  Cardiology.   PROCEDURE:  Adenosine Cardiolite as stated above.   CONDITION ON DISCHARGE:  Improved and satisfactory.   REASON FOR ADMISSION:   Alcohol withdrawal and chest pain.  The patient  presented with chest pain which had resolved at the time of my evaluation.  This was related to alcohol abuse and cocaine abuse.  On admission, he had a  complaint of DT's and shakiness.  He was seeing yellow lights and felt bugs  crawling all over his skin.  On admission, he was evaluated by the [facility] team  and he was noted to have a high CIWA score.  It was therefore determined  that the patient needed to be admitted to the medical service for  supervision prior to psychiatric service intervention.  On admission, the  patient was hemodynamically stable and his cardiac and pulmonary exam was  unremarkable.  He had his cardiac markers cycles which were negative for  myocardial infarction.  He was put on Ativan protocol for alcohol  withdrawal.  His withdrawal resolved, subsequently he had complained that he  wanted to, he felt like he was going to kill himself and on account of  suicidal issues, he was given a sitter to watch over him.  He was seen by  psychiatry and after further review, the patient was recommended for  inpatient psychiatric treatment but he refused and it was deemed that he was  not committable.  He was counseled regarding this issue and he has agreed to  enroll in outpatient [facility] on discharge.   On account of the patient not having a primary care physician and also the  fact that he does not work and just not seem to have any good social  support, we were very careful in discharge process.  He indeed, went onto  get a stress test for him to be able to look at his heart.  We ran two  different echocardiograms for him which the studies were said to be  suboptimal and completed on Jun 24, 2004.  The ejection fraction was said to  be between 55-65%.  His lipid panel indicated low HDL and therefore he was  set up on a low dose Zocor.  The patient is hypertensive and he is going to  continue his lisinopril.  His blood pressures  have been controlled while he  was in the hospital.  He is going to be sent home today and is to be seen by  the social workers for two things:  One, to assist him with the medications  and also to arrange for outpatient followups at [facility] as well as  outpatient evaluation of his home situation.  He is discharged home in  stable physical condition.  On rounds, he denies any chest pain or shortness  of breath.  He is dressed up and is and ambulatory without any problems.  He  does not have any tremors.  He is alert and oriented x3.  His discharge  vital signs are 105/65 for blood pressure, pulse is 57, respirations 20,  temperature 97.9 degrees Fahrenheit and O2 saturation of 97% on room air.  His cardiopulmonary exams are unremarkable and he does not have any cords in  his extremities, no cyanosis.      GO/MEDQ  D:  06/28/2004  T:  06/28/2004  Job:  [id number]
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
## 2010-07-08 LAB — CBC
Hemoglobin: 9.6 g/dL — ABNORMAL LOW (ref 13.0–17.0)
MCH: 30.6 pg (ref 26.0–34.0)
MCHC: 33.6 g/dL (ref 30.0–36.0)
RDW: 13.1 % (ref 11.5–15.5)

## 2010-08-01 IMAGING — CR DG CHEST 2V
2 series · 2 of 2 positions shown · non-contrast
Comparison: Portable chest 06/20/2004.

CLINICAL DATA: COPD, cough and congestion.

CHEST - 2 VIEW

[view not recorded (1 of 2)]
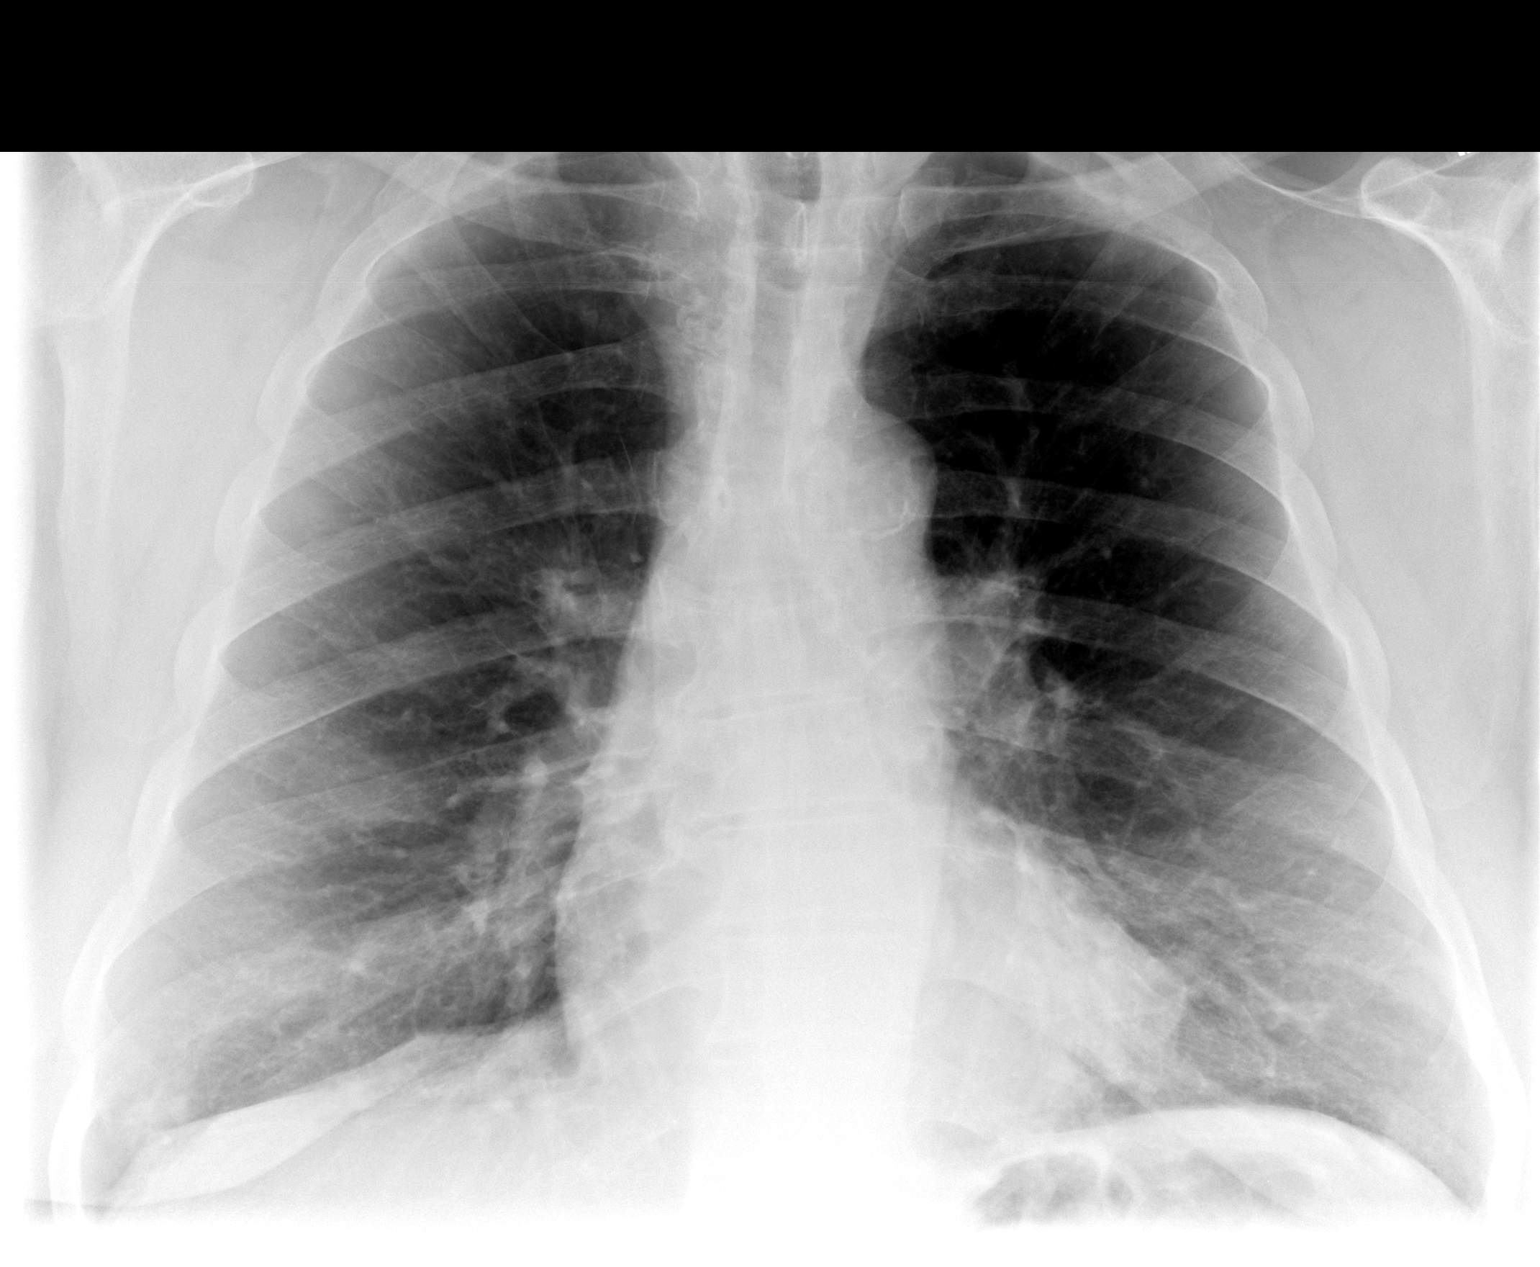

[view not recorded (2 of 2)]
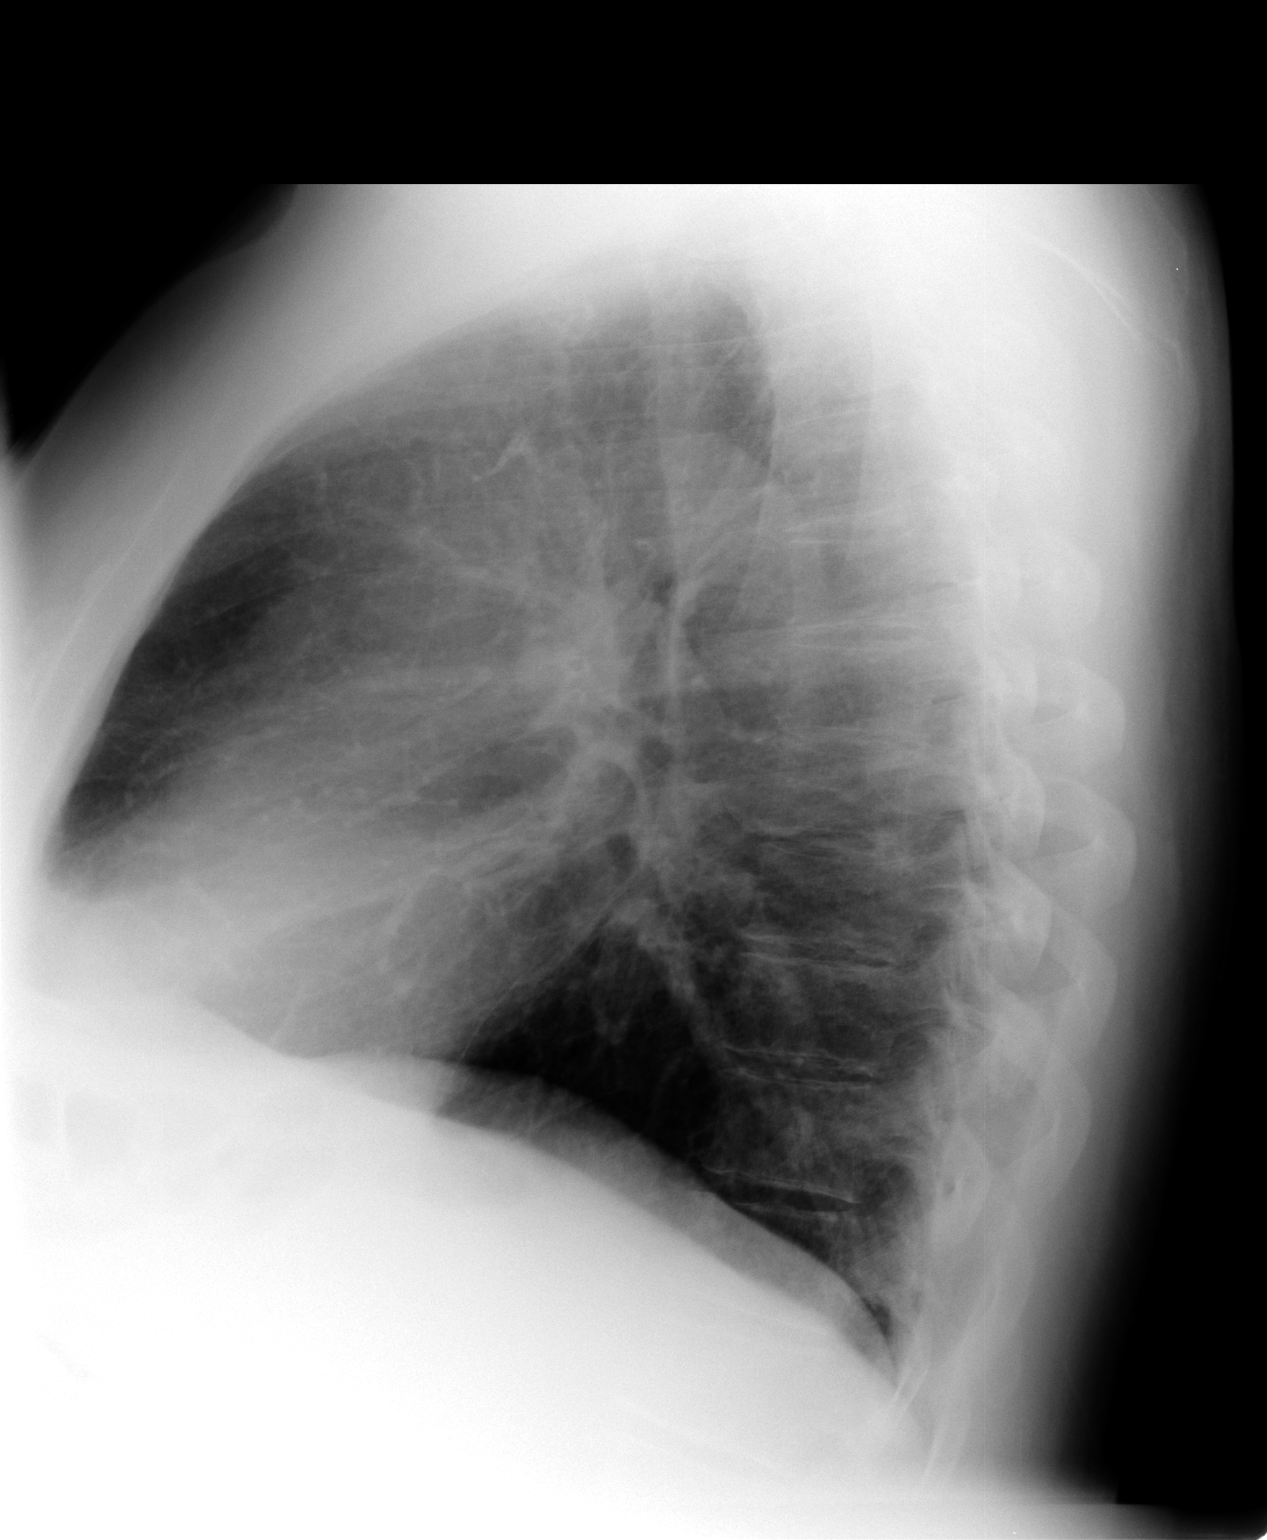

[2 of 2 positions shown; findings below may reference images not displayed]

FINDINGS: The chest is hyperexpanded.  Small nodular opacity in the
periphery the right lung bases likely atelectatic change.  There is
no pleural effusion.  Heart size is normal.
IMPRESSION: 1.  Findings compatible with emphysema with a small nodular opacity
in the right lower lung zone.  Recommend follow-up films in 2-3
weeks.  Finding likely reflects atelectasis.

## 2010-08-19 ENCOUNTER — Encounter: Payer: Self-pay | Admitting: Family Medicine

## 2010-08-19 ENCOUNTER — Ambulatory Visit (INDEPENDENT_AMBULATORY_CARE_PROVIDER_SITE_OTHER): Payer: Medicare Other | Admitting: Family Medicine

## 2010-08-19 VITALS — BP 140/90 | Temp 98.0°F | Wt 334.0 lb

## 2010-08-19 DIAGNOSIS — D649 Anemia, unspecified: Secondary | ICD-10-CM

## 2010-08-19 DIAGNOSIS — M1990 Unspecified osteoarthritis, unspecified site: Secondary | ICD-10-CM

## 2010-08-19 DIAGNOSIS — R030 Elevated blood-pressure reading, without diagnosis of hypertension: Secondary | ICD-10-CM

## 2010-08-19 DIAGNOSIS — J449 Chronic obstructive pulmonary disease, unspecified: Secondary | ICD-10-CM

## 2010-08-19 LAB — CBC WITH DIFFERENTIAL/PLATELET
Basophils Absolute: 0.1 10*3/uL (ref 0.0–0.1)
Eosinophils Absolute: 0.3 10*3/uL (ref 0.0–0.7)
MCHC: 34.6 g/dL (ref 30.0–36.0)
MCV: 92.6 fl (ref 78.0–100.0)
Monocytes Absolute: 0.6 10*3/uL (ref 0.1–1.0)
Neutrophils Relative %: 58.6 % (ref 43.0–77.0)
Platelets: 191 10*3/uL (ref 150.0–400.0)

## 2010-08-19 NOTE — Progress Notes (Signed)
  Subjective:    Patient ID: Edgar Perez, male    DOB: [birth year], 60 y.o.   MRN: [id number]  HPI History of anemia following colonoscopy. Recently donated blood so presumably hemoglobin back up. No dizziness. Did take iron for several months but is off currently. No rectal bleeding. No abdominal pain.  History of COPD. Quit smoking 3 years ago. Uses Combivent and Advair. No change in respiratory status.  History of GERD and controlled with Prilosec 20 mg daily. Brother with esophageal cancer.  Left knee pains. Chronic and intermittent for several years. No recent injury. No effusion. Glucosamine and Aleve help. No warmth or erythema. No locking or giving way.   Review of Systems  Constitutional: Negative for fever, chills, appetite change and unexpected weight change.  Respiratory: Negative for cough and shortness of breath.   Cardiovascular: Negative for chest pain, palpitations and leg swelling.  Genitourinary: Negative for dysuria.       Objective:   Physical Exam  Constitutional: He appears well-developed and well-nourished. No distress.  HENT:  Mouth/Throat: Oropharynx is clear and moist. No oropharyngeal exudate.  Neck: Neck supple.  Cardiovascular: Normal rate, regular rhythm and normal heart sounds.   Pulmonary/Chest: Effort normal and breath sounds normal. No respiratory distress. He has no wheezes. He has no rales.  Musculoskeletal: He exhibits no edema.  Lymphadenopathy:    He has no cervical adenopathy.          Assessment & Plan:  #1 history of anemia. Reassess CBC #2 elevated blood pressure with no history of hypertension. Work on weight loss and reassess 6 months #3 COPD. Continue current inhalers. Flu vaccine by fall #4 left knee pain. Probably has some component of osteoarthritis especially with his weight. Work on weight loss and cautious Aleve and supplement with tramadol as needed.

## 2010-08-20 NOTE — Progress Notes (Signed)
Quick Note:  Pt informed ______ 

## 2010-09-11 ENCOUNTER — Other Ambulatory Visit: Payer: Self-pay | Admitting: *Deleted

## 2010-09-11 ENCOUNTER — Telehealth: Payer: Self-pay | Admitting: Family Medicine

## 2010-09-11 MED ORDER — VARDENAFIL HCL 20 MG PO TABS: 20.0000 mg | ORAL_TABLET | Freq: Every day | ORAL | Status: DC | PRN

## 2010-09-11 NOTE — Telephone Encounter (Signed)
Pt requesting refill on Vardenafil to [facility]

## 2010-09-11 NOTE — Telephone Encounter (Signed)
Rx filled earlier today, pt informed

## 2010-10-17 ENCOUNTER — Ambulatory Visit: Payer: Medicare Other | Admitting: Family Medicine

## 2011-02-19 ENCOUNTER — Ambulatory Visit (INDEPENDENT_AMBULATORY_CARE_PROVIDER_SITE_OTHER): Payer: Medicare Other | Admitting: Family Medicine

## 2011-02-19 DIAGNOSIS — L281 Prurigo nodularis: Secondary | ICD-10-CM

## 2011-02-19 DIAGNOSIS — L280 Lichen simplex chronicus: Secondary | ICD-10-CM

## 2011-02-19 DIAGNOSIS — M1990 Unspecified osteoarthritis, unspecified site: Secondary | ICD-10-CM

## 2011-02-19 DIAGNOSIS — K219 Gastro-esophageal reflux disease without esophagitis: Secondary | ICD-10-CM

## 2011-02-19 DIAGNOSIS — J449 Chronic obstructive pulmonary disease, unspecified: Secondary | ICD-10-CM

## 2011-02-19 MED ORDER — VARDENAFIL HCL 20 MG PO TABS: 20.0000 mg | ORAL_TABLET | Freq: Every day | ORAL | Status: AC | PRN

## 2011-02-19 MED ORDER — TRIAMCINOLONE ACETONIDE 0.1 % EX CREA: TOPICAL_CREAM | Freq: Two times a day (BID) | CUTANEOUS | Status: DC

## 2011-02-19 NOTE — Progress Notes (Signed)
  Subjective:    Patient ID: Edgar Perez, male    DOB: 09-23-1950, 61 y.o.   MRN: [id number]  HPI  Here for medical follow up.  He has history of COPD. Quit smoking 2 years ago. Takes Advair and Combivent. Respiratory status stable. A couple of recent colds have resolved. He does have some dyspnea with activity which is chronic. No productive cough.  He has history of erectile dysfunction. Requesting refills Levitra. Libido relatively normal.  History of GERD stable on omeprazole. He has other problems pruritic nodules mostly upper back neck and upper extremities. Has not tried topical treatments for this. No clear exacerbating factors.   Review of Systems  Constitutional: Negative for fever, chills and unexpected weight change.  Respiratory: Positive for cough. Negative for shortness of breath and wheezing.   Cardiovascular: Negative for chest pain, palpitations and leg swelling.  Gastrointestinal: Negative for abdominal pain.  Genitourinary: Negative for dysuria.  Skin: Positive for rash.  Neurological: Negative for headaches.       Objective:   Physical Exam  Constitutional: He appears well-developed and well-nourished.  HENT:  Mouth/Throat: Oropharynx is clear and moist.  Neck: Neck supple. No thyromegaly present.  Cardiovascular: Normal rate and regular rhythm.   Pulmonary/Chest: Effort normal and breath sounds normal. No respiratory distress. He has no wheezes. He has no rales.  Musculoskeletal: He exhibits no edema.  Lymphadenopathy:    He has no cervical adenopathy.  Skin:       Patient has multiple excoriated nodules upper back and neck region. No pustules. No vesicles.          Assessment & Plan:  #1 COPD. Stable. Immunizations up-to-date including Pneumovax and influenza. 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 Triamcinolone 0.1% cream twice a day as needed. Avoid scratching  #3 erectile dysfunction. Refill Levitra for one year  #4 health  maintenance schedule complete physical

## 2011-02-21 ENCOUNTER — Encounter: Payer: Self-pay | Admitting: Family Medicine

## 2011-02-27 ENCOUNTER — Encounter: Payer: Self-pay | Admitting: Gastroenterology

## 2011-02-27 ENCOUNTER — Telehealth: Payer: Self-pay | Admitting: Family Medicine

## 2011-02-27 NOTE — Telephone Encounter (Signed)
Pt informed on VM phone number left on message

## 2011-02-27 NOTE — Telephone Encounter (Signed)
I would like for him to add OTC Zyrtec 10 mg daily and may supplement with benadryl at night as needed

## 2011-02-27 NOTE — Telephone Encounter (Signed)
Pt is still taking triamcinolone cream however pt is still experiencing a lot of itching. Pt would like to try OTC or rx call into [facility]

## 2011-03-04 ENCOUNTER — Encounter: Payer: Self-pay | Admitting: Gastroenterology

## 2011-03-18 ENCOUNTER — Other Ambulatory Visit (INDEPENDENT_AMBULATORY_CARE_PROVIDER_SITE_OTHER): Payer: Medicare Other

## 2011-03-18 ENCOUNTER — Telehealth: Payer: Self-pay | Admitting: Family Medicine

## 2011-03-18 DIAGNOSIS — D369 Benign neoplasm, unspecified site: Secondary | ICD-10-CM

## 2011-03-18 DIAGNOSIS — E669 Obesity, unspecified: Secondary | ICD-10-CM

## 2011-03-18 DIAGNOSIS — E8881 Metabolic syndrome: Secondary | ICD-10-CM

## 2011-03-18 DIAGNOSIS — Z79899 Other long term (current) drug therapy: Secondary | ICD-10-CM

## 2011-03-18 DIAGNOSIS — Z0000 Encounter for general adult medical examination without abnormal findings: Secondary | ICD-10-CM

## 2011-03-18 DIAGNOSIS — Z125 Encounter for screening for malignant neoplasm of prostate: Secondary | ICD-10-CM

## 2011-03-18 LAB — CBC WITH DIFFERENTIAL/PLATELET
Eosinophils Absolute: 0.2 10*3/uL (ref 0.0–0.7)
Eosinophils Relative: 2.3 % (ref 0.0–5.0)
HCT: 42 % (ref 39.0–52.0)
Lymphs Abs: 2.2 10*3/uL (ref 0.7–4.0)
MCHC: 34.3 g/dL (ref 30.0–36.0)
MCV: 92.6 fl (ref 78.0–100.0)
Monocytes Absolute: 0.7 10*3/uL (ref 0.1–1.0)
Neutrophils Relative %: 68.6 % (ref 43.0–77.0)
Platelets: 223 10*3/uL (ref 150.0–400.0)
RDW: 13.1 % (ref 11.5–14.6)

## 2011-03-18 LAB — POCT URINALYSIS DIPSTICK
Ketones, UA: NEGATIVE
Leukocytes, UA: NEGATIVE
Nitrite, UA: NEGATIVE
Protein, UA: NEGATIVE
Urobilinogen, UA: 0.2

## 2011-03-18 LAB — HEPATIC FUNCTION PANEL
ALT: 25 U/L (ref 0–53)
Albumin: 4.3 g/dL (ref 3.5–5.2)
Bilirubin, Direct: 0 mg/dL (ref 0.0–0.3)
Total Bilirubin: 0.6 mg/dL (ref 0.3–1.2)
Total Protein: 7.6 g/dL (ref 6.0–8.3)

## 2011-03-18 LAB — BASIC METABOLIC PANEL
GFR: 96.95 mL/min (ref >60.00)
Potassium: 5.2 mEq/L — ABNORMAL HIGH (ref 3.5–5.1)
Sodium: 138 mEq/L (ref 135–145)

## 2011-03-18 LAB — PSA: PSA: 0.98 ng/mL (ref 0.10–4.00)

## 2011-03-18 LAB — LIPID PANEL: VLDL: 14 mg/dL (ref 0.0–40.0)

## 2011-03-18 NOTE — Telephone Encounter (Signed)
Patient complaint of itching/rash - was told to take Benadryl - but feels it is not working.  Please call patient and advise on what other meds he can use to relieve itching.

## 2011-03-18 NOTE — Telephone Encounter (Signed)
Unfortunately, there probably is no tablet that will help.  He should avoid scratching.  There is an older medication that could help with itching (Doxepin) but has associated side effects of constipation and dry mouth.

## 2011-03-18 NOTE — Telephone Encounter (Signed)
Pt was seen by dr Caryl Never for this problem(itching/rash) Pt would like nancy to return his call

## 2011-03-18 NOTE — Telephone Encounter (Signed)
Pt informed, he has OV next week and will further discuss

## 2011-03-18 NOTE — Telephone Encounter (Signed)
Message left for pt to come in for evaluation

## 2011-03-18 NOTE — Telephone Encounter (Signed)
Pt states Dr Caryl Never has seen the little bumps that itich, and the cream is working great, the Zyrtec and benadryl is not really helping the itiching,  He is questioning if there is a Rx that would help with continued  itiching?

## 2011-03-18 NOTE — Telephone Encounter (Signed)
Please advise 

## 2011-03-25 ENCOUNTER — Encounter: Payer: Self-pay | Admitting: Family Medicine

## 2011-03-25 ENCOUNTER — Ambulatory Visit (INDEPENDENT_AMBULATORY_CARE_PROVIDER_SITE_OTHER): Payer: Medicare Other | Admitting: Family Medicine

## 2011-03-25 VITALS — BP 150/90 | HR 80 | Temp 97.8°F | Resp 12 | Ht 69.5 in | Wt 340.0 lb

## 2011-03-25 DIAGNOSIS — E8881 Metabolic syndrome: Secondary | ICD-10-CM | POA: Insufficient documentation

## 2011-03-25 DIAGNOSIS — Z0000 Encounter for general adult medical examination without abnormal findings: Secondary | ICD-10-CM

## 2011-03-25 DIAGNOSIS — E669 Obesity, unspecified: Secondary | ICD-10-CM

## 2011-03-25 DIAGNOSIS — D369 Benign neoplasm, unspecified site: Secondary | ICD-10-CM

## 2011-03-25 NOTE — Progress Notes (Signed)
Subjective:    Patient ID: Edgar Perez, male    DOB: Jan 18, 1951, 61 y.o.   MRN: [id number]  HPI  Patient here for complete physical examination. He has history of obesity, GERD, osteoarthritis, and adenomatous colon polyp. Had colonoscopy approximately several months ago with serrated adenoma and had some bleeding following biopsy. Recommendation for repeat colonoscopy at this time. Patient is willing to set up.  Immunizations are up to date. He had flu vaccine outside this office  Past Medical History  Diagnosis Date  . COPD (chronic obstructive pulmonary disease)   . Arthritis   . GERD (gastroesophageal reflux disease)   . Folliculitis   . Substance abuse     recovering addict   Past Surgical History  Procedure Date  . Tonsillectomy   . Sinusotomy     left side twice  . Finger surgery   . Vasectomy     reports that he quit smoking about 4 years ago. He does not have any smokeless tobacco history on file. He reports that he does not drink alcohol or use illicit drugs. family history includes Alcohol abuse in his father; Cancer in his brother and mother; Esophageal cancer in his brother; Heart disease (age of onset:53) in his brother; and Stomach cancer in his mother. Allergies  Allergen Reactions  . Codeine     REACTION: nausea and vomiting      Review of Systems  Constitutional: Negative for fever, activity change, appetite change and fatigue.  HENT: Negative for ear pain, congestion and trouble swallowing.   Eyes: Negative for pain and visual disturbance.  Respiratory: Negative for cough, shortness of breath and wheezing.   Cardiovascular: Negative for chest pain and palpitations.  Gastrointestinal: Negative for nausea, vomiting, abdominal pain, diarrhea, constipation, blood in stool, abdominal distention and rectal pain.  Genitourinary: Negative for dysuria, hematuria and testicular pain.  Musculoskeletal: Negative for joint swelling and arthralgias.  Skin:  Negative for rash.  Neurological: Negative for dizziness, syncope and headaches.  Hematological: Negative for adenopathy.  Psychiatric/Behavioral: Negative for confusion and dysphoric mood.       Objective:   Physical Exam  Constitutional: He is oriented to person, place, and time. He appears well-developed and well-nourished. No distress.  HENT:  Head: Normocephalic and atraumatic.  Right Ear: External ear normal.  Left Ear: External ear normal.  Mouth/Throat: Oropharynx is clear and moist.  Eyes: Conjunctivae and EOM are normal. Pupils are equal, round, and reactive to light.  Neck: Normal range of motion. Neck supple. No thyromegaly present.  Cardiovascular: Normal rate, regular rhythm and normal heart sounds.   No murmur heard. Pulmonary/Chest: No respiratory distress. He has no wheezes. He has no rales.  Abdominal: Soft. Bowel sounds are normal. He exhibits no distension and no mass. There is no tenderness. There is no rebound and no guarding.  Musculoskeletal: He exhibits no edema.  Lymphadenopathy:    He has no cervical adenopathy.  Neurological: He is alert and oriented to person, place, and time. He displays normal reflexes. No cranial nerve deficit.  Skin: No rash noted.  Psychiatric: He has a normal mood and affect.          Assessment & Plan:  Complete physical. Patient is to work on weight loss. Blood pressure not well controlled today. Immunizations up to date. Set up repeat colonoscopy which patient is willing to go for this time.  Elevated blood pressure. Repeat reading by me 144/92 left arm seated large cuff after rest. Monitor closely. Weight  loss in the meantime.

## 2011-03-25 NOTE — Patient Instructions (Signed)
Work on weight loss and continue to monitor blood pressure

## 2011-04-03 ENCOUNTER — Encounter: Payer: Self-pay | Admitting: Gastroenterology

## 2011-04-08 ENCOUNTER — Encounter: Payer: Self-pay | Admitting: Gastroenterology

## 2011-04-08 ENCOUNTER — Ambulatory Visit (AMBULATORY_SURGERY_CENTER): Payer: Medicare Other | Admitting: *Deleted

## 2011-04-08 VITALS — Ht 69.0 in | Wt 339.6 lb

## 2011-04-08 DIAGNOSIS — Z1211 Encounter for screening for malignant neoplasm of colon: Secondary | ICD-10-CM

## 2011-04-08 MED ORDER — PEG-KCL-NACL-NASULF-NA ASC-C 100 G PO SOLR: 1.0000 | Freq: Once | ORAL | Status: DC

## 2011-04-22 ENCOUNTER — Encounter: Payer: Self-pay | Admitting: Gastroenterology

## 2011-04-22 ENCOUNTER — Ambulatory Visit (AMBULATORY_SURGERY_CENTER): Payer: Medicare Other | Admitting: Gastroenterology

## 2011-04-22 VITALS — BP 148/79 | HR 68 | Temp 98.3°F | Resp 20 | Ht 69.0 in | Wt 339.0 lb

## 2011-04-22 DIAGNOSIS — K5730 Diverticulosis of large intestine without perforation or abscess without bleeding: Secondary | ICD-10-CM

## 2011-04-22 DIAGNOSIS — Z1211 Encounter for screening for malignant neoplasm of colon: Secondary | ICD-10-CM

## 2011-04-22 DIAGNOSIS — Z860100 Personal history of colon polyps, unspecified: Secondary | ICD-10-CM

## 2011-04-22 MED ORDER — SODIUM CHLORIDE 0.9 % IV SOLN: 500.0000 mL | INTRAVENOUS | Status: DC

## 2011-04-22 NOTE — Patient Instructions (Addendum)
YOU HAD AN ENDOSCOPIC PROCEDURE TODAY AT THE [facility]: Refer to the procedure report that was given to you for any specific questions about what was found during the examination.  If the procedure report does not answer your questions, please call your gastroenterologist to clarify.  If you requested that your care partner not be given the details of your procedure findings, then the procedure report has been included in a sealed envelope for you to review at your convenience later.  YOU SHOULD EXPECT: Some feelings of bloating in the abdomen. Passage of more gas than usual.  Walking can help get rid of the air that was put into your GI tract during the procedure and reduce the bloating. If you had a lower endoscopy (such as a colonoscopy or flexible sigmoidoscopy) you may notice spotting of blood in your stool or on the toilet paper. If you underwent a bowel prep for your procedure, then you may not have a normal bowel movement for a few days.  DIET: Your first meal following the procedure should be a light meal and then it is ok to progress to your normal diet.  A half-sandwich or bowl of soup is an example of a good first meal.  Heavy or fried foods are harder to digest and may make you feel nauseous or bloated.  Likewise meals heavy in dairy and vegetables can cause extra gas to form and this can also increase the bloating.  Drink plenty of fluids but you should avoid alcoholic beverages for 24 hours.  ACTIVITY: Your care partner should take you home directly after the procedure.  You should plan to take it easy, moving slowly for the rest of the day.  You can resume normal activity the day after the procedure however you should NOT DRIVE or use heavy machinery for 24 hours (because of the sedation medicines used during the test).    SYMPTOMS TO REPORT IMMEDIATELY: A gastroenterologist can be reached at any hour.  During normal business hours, 8:30 AM to 5:00 PM Monday through Friday,  call [phone].  After hours and on weekends, please call the GI answering service at [phone] who will take a message and have the physician on call contact you.   Following lower endoscopy (colonoscopy or flexible sigmoidoscopy):  Excessive amounts of blood in the stool  Significant tenderness or worsening of abdominal pains  Swelling of the abdomen that is new, acute  Fever of 100F or higher   FOLLOW UP: If any biopsies were taken you will be contacted by phone or by letter within the next 1-3 weeks.  Call your gastroenterologist if you have not heard about the biopsies in 3 weeks.  Our staff will call the home number listed on your records the next business day following your procedure to check on you and address any questions or concerns that you may have at that time regarding the information given to you following your procedure. This is a courtesy call and so if there is no answer at the home number and we have not heard from you through the emergency physician on call, we will assume that you have returned to your regular daily activities without incident.  SIGNATURES/CONFIDENTIALITY: You and/or your care partner have signed paperwork which will be entered into your electronic medical record.  These signatures attest to the fact that that the information above on your After Visit Summary has been reviewed and is understood.  Full responsibility of the confidentiality of   this discharge information lies with you and/or your care-partner.    INFORMATION ON DIVERTICULOSIS & HIGH FIBER DIET GIVEN TO YOU TODAY 

## 2011-04-22 NOTE — Op Note (Signed)
[facility] [address] [place], [place]  [address]  COLONOSCOPY PROCEDURE REPORT  PATIENT:  Edgar Perez, Edgar Perez  MR#:  [id number] BIRTHDATE:  01-02-1951, 60 yrs. old  GENDER:  male ENDOSCOPIST:  Barbette Hair. Arlyce Dice, MD REF. BY: PROCEDURE DATE:  04/22/2011 PROCEDURE:  Diagnostic Colonoscopy ASA CLASS:  Class II INDICATIONS:  Screening, history of pre-cancerous (adenomatous) colon polyps 2012 Sessile cecal polyp MEDICATIONS:   MAC sedation, administered by CRNA propofol 280mg IV  DESCRIPTION OF PROCEDURE:   After the risks benefits and alternatives of the procedure were thoroughly explained, informed consent was obtained.  Digital rectal exam was performed and revealed no abnormalities.   The LB [id number] endoscope was introduced through the anus and advanced to the cecum, which was identified by both the appendix and ileocecal valve, without limitations.  The quality of the prep was good, using MoviPrep. The instrument was then slowly withdrawn as the colon was fully examined. <<PROCEDUREIMAGES>>  FINDINGS:  Moderate diverticulosis was found in the sigmoid to descending colon segments (see image5).  This was otherwise a normal examination of the colon (see image2, image1, and image7). Retroflexed views in the rectum revealed no abnormalities.    The time to cecum =  1) 3.50  minutes. The scope was then withdrawn in 1) 8.50  minutes from the cecum and the procedure completed. COMPLICATIONS:  None ENDOSCOPIC IMPRESSION: 1) Moderate diverticulosis in the sigmoid to descending colon segments 2) Otherwise normal examination RECOMMENDATIONS: 1) Colonoscopy 3 years REPEAT EXAM:  In 3 year(s) for Colonoscopy.  ______________________________ Barbette Hair. Arlyce Dice, MD  CC:  Evelena Peat, MD  n. Rosalie DoctorBarbette Hair. Kestrel Mis at 04/22/2011 11:03 AM  Westley Hummer, [id number]

## 2011-04-22 NOTE — Progress Notes (Signed)
Patient did not have preoperative order for IV antibiotic SSI prophylaxis. (G8918)  Patient did not experience any of the following events: a burn prior to discharge; a fall within the facility; wrong site/side/patient/procedure/implant event; or a hospital transfer or hospital admission upon discharge from the facility. (G8907)  

## 2011-04-22 NOTE — Progress Notes (Signed)
Propofol was administered by Sheila Camp, CRNA. Maw  The pt tolerated the colonoscopy very well. Maw   

## 2011-04-23 ENCOUNTER — Telehealth: Payer: Self-pay | Admitting: *Deleted

## 2011-04-23 NOTE — Telephone Encounter (Signed)
  Follow up Call-  Call back number 04/22/2011 05/12/2010  Post procedure Call Back phone  # [phone]  Permission to leave phone message Yes -     Patient questions:  Do you have a fever, pain , or abdominal swelling? no Pain Score  0 *  Have you tolerated food without any problems? yes  Have you been able to return to your normal activities? yes  Do you have any questions about your discharge instructions: Diet   no Medications  no Follow up visit  no  Do you have questions or concerns about your Care? no  Actions: * If pain score is 4 or above: No action needed, pain <4.

## 2011-07-03 ENCOUNTER — Encounter: Payer: Self-pay | Admitting: Family Medicine

## 2011-07-03 ENCOUNTER — Ambulatory Visit (INDEPENDENT_AMBULATORY_CARE_PROVIDER_SITE_OTHER): Payer: Medicare Other | Admitting: Family Medicine

## 2011-07-03 VITALS — BP 160/100 | Temp 98.5°F | Wt 345.0 lb

## 2011-07-03 DIAGNOSIS — R6889 Other general symptoms and signs: Secondary | ICD-10-CM

## 2011-07-03 DIAGNOSIS — E8881 Metabolic syndrome: Secondary | ICD-10-CM

## 2011-07-03 DIAGNOSIS — I499 Cardiac arrhythmia, unspecified: Secondary | ICD-10-CM

## 2011-07-03 DIAGNOSIS — R0989 Other specified symptoms and signs involving the circulatory and respiratory systems: Secondary | ICD-10-CM

## 2011-07-03 NOTE — Progress Notes (Addendum)
Subjective:    Patient ID: Edgar Perez, male    DOB: [birth year], 61 y.o.   MRN: [id number]  HPI  Patient seen and evaluated couple of items as below. Yesterday, apparently insurance company sent out nurse practitioner to do a basic evaluation. There was question of atrial fibrillation with irregularly irregular heart rhythm per report. Denies any prior history of atrial fibrillation. He denies any dizziness or chest pains. No dyspnea.  Patient was noted to have right carotid bruit with absence of left carotid bruit. No recent TIA symptoms. No history of cerebrovascular disease.  Blood pressure was 136/90 left arm and 122/78 right arm. He has not had any right upper extremity pain or any color changes or temperature changes. No history of any no subclavian stenosis.  Past Medical History  Diagnosis Date  . COPD (chronic obstructive pulmonary disease)   . Arthritis   . GERD (gastroesophageal reflux disease)   . Folliculitis   . Substance abuse     recovering addict  . Allergy   . Seasonal allergies   . Depression     does not take any medications   Past Surgical History  Procedure Date  . Tonsillectomy   . Sinusotomy     left side twice  . Finger surgery   . Vasectomy   . Colonoscopy     2012  . Polypectomy     2012    reports that he quit smoking about 4 years ago. He has never used smokeless tobacco. He reports that he does not drink alcohol or use illicit drugs. family history includes Alcohol abuse in his father; Cancer in his brother and mother; Esophageal cancer in his brother; Heart disease (age of onset:53) in his brother; and Stomach cancer in his mother.  There is no history of Colon cancer and Rectal cancer. Allergies  Allergen Reactions  . Codeine     REACTION: nausea and vomiting      Review of Systems  Constitutional: Negative for appetite change and unexpected weight change.  Respiratory: Negative for cough, shortness of breath and wheezing.     Cardiovascular: Negative for chest pain, palpitations and leg swelling.  Gastrointestinal: Negative for abdominal pain.  Neurological: Negative for dizziness, seizures and syncope.       Objective:   Physical Exam  Constitutional: He appears well-developed and well-nourished.  HENT:  Mouth/Throat: Oropharynx is clear and moist.  Neck: Neck supple.       Patient does have right carotid bruit. No bruit noted on the left. No masses. No adenopathy.  Cardiovascular: Normal rate and regular rhythm.  Exam reveals no gallop.   Pulmonary/Chest: Effort normal and breath sounds normal. No respiratory distress. He has no wheezes. He has no rales.  Musculoskeletal: He exhibits no edema.       Patient has 2+ palpated radial pulses right and left upper extremity          Assessment & Plan:  #1 irregular heart rhythm by report from yesterday (?a fib), though he appears to be in sinus rhythm by exam today. Obtain EKG. Consider heart monitor #2 right carotid bruit. No recent TIA symptoms. Carotid Dopplers. #3 differential in blood pressure with right upper extremity consistently less than left upper extremity. By my assessment today he had 150 systolic - left arm which is both auscultated and palpated and 120 right upper extremity. Consider subclavian Doppler study  Received verbal report for carotid Doppler. High-grade stenosis right internal carotid artery with moderate stenosis left  internal carotid artery.  Patient also has significant decrease blood pressure right upper extremity compared to left. We'll go ahead and set up referral to [facility] for further evaluation No recent TIA type symptoms

## 2011-07-10 ENCOUNTER — Encounter (INDEPENDENT_AMBULATORY_CARE_PROVIDER_SITE_OTHER): Payer: Medicare Other

## 2011-07-10 DIAGNOSIS — I499 Cardiac arrhythmia, unspecified: Secondary | ICD-10-CM

## 2011-07-10 DIAGNOSIS — R6889 Other general symptoms and signs: Secondary | ICD-10-CM

## 2011-07-10 DIAGNOSIS — I6529 Occlusion and stenosis of unspecified carotid artery: Secondary | ICD-10-CM

## 2011-07-10 DIAGNOSIS — R0989 Other specified symptoms and signs involving the circulatory and respiratory systems: Secondary | ICD-10-CM

## 2011-07-10 DIAGNOSIS — I4891 Unspecified atrial fibrillation: Secondary | ICD-10-CM

## 2011-07-10 NOTE — Progress Notes (Signed)
Addended by: Kristian Covey on: 07/10/2011 05:22 PM   Modules accepted: Orders

## 2011-07-13 ENCOUNTER — Telehealth: Payer: Self-pay | Admitting: Family Medicine

## 2011-07-13 NOTE — Telephone Encounter (Signed)
Verbal authorization received from [place] Public affairs consultant) allowing me to fax medical records prior to release of medical records from pt signed.  We did receive confirmation the fax did go through.  Pt informed, release of medical sent to pt home for signing and return to our office

## 2011-07-13 NOTE — Telephone Encounter (Signed)
Long discussion with patient. He is very upset that we did not pick up on his potential heart arrhythmia though again he was not atrial fibrillation, visit here. He is also very upset that we did not pick up on his carotid bruit. He has decided to go to [facility] for further evaluation. Will help arrange this transfer record. He has not had any TIA-type symptoms. He is in process of Holter monitor at this point which has not been completed.   Please send recent carotid Doppler results along with recent office notes and labs to [facility]

## 2011-07-13 NOTE — Telephone Encounter (Signed)
Pt called back again and said that [facility] had spoken with Dr Caryl Never nurse and was told that his records were not going to be released, but Dr Caryl Never agreed to release records to [facility]. Pls call Dr Derinda Late, secretary, Alcario Drought at [phone].

## 2011-07-13 NOTE — Telephone Encounter (Signed)
Pt called and said that he is very upset that a very serious heart condition, that evidently pt has had for a while, was not found by his pcp and this condition was discovered by a nurse and dr at another office. 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   Pt is sch to see another doctor in [place] on Friday and is req to get his medical records sent to other facility. Pt has been advised that records can not be sent without signed release. Pt says that he lives 25 miles away and can not come in to sign release. Recommended that pt can go to his ov on Friday and have them fax over a signed release while at other facility and then his records could be sent then, but pt said that the other doctor is wanting to review pts records at time of his appt. Pt req that Dr Caryl Never call pt today.

## 2011-07-13 NOTE — Telephone Encounter (Signed)
Please advise 

## 2011-07-15 ENCOUNTER — Encounter: Payer: Self-pay | Admitting: Vascular Surgery

## 2011-07-16 ENCOUNTER — Encounter: Payer: Medicare Other | Admitting: Vascular Surgery

## 2011-07-20 ENCOUNTER — Encounter: Payer: Self-pay | Admitting: Family Medicine

## 2011-08-06 ENCOUNTER — Ambulatory Visit: Payer: Medicare Other | Admitting: Family Medicine

## 2011-08-11 ENCOUNTER — Encounter: Payer: Self-pay | Admitting: Family Medicine

## 2011-08-12 NOTE — Progress Notes (Signed)
Quick Note:  Holter Event Monitor report faxed to Jamey Reas, MD @ [phone], confirmation received. ______

## 2011-10-18 ENCOUNTER — Encounter: Payer: Self-pay | Admitting: Family Medicine

## 2011-10-29 ENCOUNTER — Telehealth: Payer: Self-pay | Admitting: Family Medicine

## 2011-10-29 NOTE — Telephone Encounter (Signed)
Received packet from [facility] to be sent out by certified mail. Envelope already addressed. Sent by registered certified mail on 10/28/2011. Tracking number [id number]. Will update upon delivery of signed receipt verifying delivery of package. 10/29/2011 rmf

## 2012-01-04 ENCOUNTER — Other Ambulatory Visit: Payer: Self-pay | Admitting: Family Medicine

## 2014-06-13 ENCOUNTER — Encounter: Payer: Self-pay | Admitting: Gastroenterology

## 2014-09-19 ENCOUNTER — Encounter: Payer: Self-pay | Admitting: Gastroenterology

## 2021-07-17 ENCOUNTER — Encounter: Payer: Self-pay | Admitting: Urology

## 2021-07-17 ENCOUNTER — Ambulatory Visit: Payer: Medicare Other | Admitting: Urology

## 2021-07-17 VITALS — BP 155/95 | HR 93 | Ht 69.0 in | Wt 345.0 lb

## 2021-07-17 DIAGNOSIS — R350 Frequency of micturition: Secondary | ICD-10-CM | POA: Diagnosis not present

## 2021-07-17 DIAGNOSIS — N3281 Overactive bladder: Secondary | ICD-10-CM | POA: Diagnosis not present

## 2021-07-17 DIAGNOSIS — N401 Enlarged prostate with lower urinary tract symptoms: Secondary | ICD-10-CM | POA: Diagnosis not present

## 2021-07-17 DIAGNOSIS — R351 Nocturia: Secondary | ICD-10-CM

## 2021-07-17 DIAGNOSIS — N138 Other obstructive and reflux uropathy: Secondary | ICD-10-CM

## 2021-07-17 LAB — URINALYSIS, ROUTINE W REFLEX MICROSCOPIC
Bilirubin, UA: NEGATIVE
Glucose, UA: NEGATIVE
Ketones, UA: NEGATIVE
Nitrite, UA: NEGATIVE
Protein,UA: NEGATIVE
RBC, UA: NEGATIVE
Specific Gravity, UA: 1.015 (ref 1.005–1.030)
Urobilinogen, Ur: 0.2 mg/dL (ref 0.2–1.0)
pH, UA: 7 (ref 5.0–7.5)

## 2021-07-17 LAB — MICROSCOPIC EXAMINATION
RBC, Urine: NONE SEEN /hpf (ref 0–2)
Renal Epithel, UA: NONE SEEN /hpf

## 2021-07-17 LAB — BLADDER SCAN AMB NON-IMAGING: Scan Result: 75

## 2021-07-17 MED ORDER — MIRABEGRON ER 50 MG PO TB24: 50.0000 mg | ORAL_TABLET | Freq: Every day | ORAL | 0 refills | Status: DC

## 2021-07-17 MED ORDER — TAMSULOSIN HCL 0.4 MG PO CAPS: 0.4000 mg | ORAL_CAPSULE | Freq: Every day | ORAL | 11 refills | Status: DC

## 2021-07-17 NOTE — Progress Notes (Signed)
Assessment: 1. Frequent urination   2. Nocturia   3. OAB (overactive bladder)   4. BPH with obstruction/lower urinary tract symptoms      Plan: I reviewed the records from [facility]. Diagnosis and management of overactive bladder discussed with the patient. Bladder diet sheet given. Recommend resuming tamsulosin 0.4 mg daily. Trial of Myrbetriq 50 mg daily.  Samples provided.  Use and side effects discussed. PSA today Return to office in 1 month.  Chief Complaint:  Chief Complaint  Patient presents with   Urinary Frequency    History of Present Illness:  Edgar Perez is a 71 y.o. year old male who is seen in consultation from Eulas Post, MD for evaluation of lower urinary tract symptoms.  He reports symptoms for approximately 1 year.  He has urinary frequency, voiding every hour, urgency, and nocturia 3-4 times.  He does have occasional urge incontinence.  He voids with a good stream.  He does report some intermittency.  No dysuria or gross hematuria. IPSS = 32 QOL = 5/5 today.  He has previously seen Dr. Johnney Ou with [facility] in [facility].  He was originally managed with tamsulosin.  He reports trying other medications but is not aware of the names.  He was recently placed on Vesicare but did not see any improvement in his symptoms.  He is not currently taking any medication for his urinary symptoms. 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  Further treatment with intravesical Botox was discussed.  No recent PSA results available   Past Medical History:  Past Medical History:  Diagnosis Date   Allergy    Arthritis    COPD (chronic obstructive pulmonary disease) ([place])    Depression    does not take any medications   Folliculitis    GERD (gastroesophageal reflux disease)    Seasonal allergies    Substance abuse ([place])    recovering addict    Past Surgical History:  Past Surgical History:   Procedure Laterality Date   COLONOSCOPY     2012   FINGER SURGERY     POLYPECTOMY     2012   SINUSOTOMY     left side twice   TONSILLECTOMY     VASECTOMY      Allergies:  Allergies  Allergen Reactions   Statins Other (See Comments)    Muscle aches/bruising/difficulty walking **History: Atorvastatin, Pravastatin, Rosuvastatin, and Simvastatin**   Codeine     REACTION: nausea and vomiting    Family History:  Family History  Problem Relation Age of Onset   Stomach cancer Mother    Cancer Mother        gastric   Esophageal cancer Brother    Cancer Brother        esophageal   Heart disease Brother 56       MI   Alcohol abuse Father    Colon cancer Neg Hx    Rectal cancer Neg Hx     Social History:  Social History   Tobacco Use   Smoking status: Former    Types: Cigarettes    Quit date: 02/10/2007    Years since quitting: 14.4   Smokeless tobacco: Never  Substance Use Topics   Alcohol use: No   Drug use: No    Review of symptoms:  Constitutional:  Negative for unexplained weight loss, night sweats, fever, chills ENT:  Negative for nose bleeds, sinus pain, painful swallowing CV:  Negative for chest  pain, shortness of breath, exercise intolerance, palpitations, loss of consciousness Resp:  Negative for cough, wheezing, shortness of breath GI:  Negative for nausea, vomiting, diarrhea, bloody stools GU:  Positives noted in HPI; otherwise negative for gross hematuria, dysuria Neuro:  Negative for seizures, poor balance, limb weakness, slurred speech Psych:  Negative for lack of energy, depression, anxiety Endocrine:  Negative for polydipsia, polyuria, symptoms of hypoglycemia (dizziness, hunger, sweating) Hematologic:  Negative for anemia, purpura, petechia, prolonged or excessive bleeding, use of anticoagulants  Allergic:  Negative for difficulty breathing or choking as a result of exposure to anything; no shellfish allergy; no allergic response (rash/itch) to  materials, foods  Physical exam: BP (!) 155/95   Pulse 93   Ht '5\' 9"'$  (1.753 m)   Wt (!) 345 lb (156.5 kg)   BMI 50.95 kg/m  GENERAL APPEARANCE:  Well appearing, well developed, well nourished, NAD HEENT: Atraumatic, Normocephalic, oropharynx clear. NECK: Supple without lymphadenopathy or thyromegaly. LUNGS: Clear to auscultation bilaterally. HEART: Regular Rate and Rhythm without murmurs, gallops, or rubs. ABDOMEN: Soft, non-tender, No Masses. EXTREMITIES: Moves all extremities well.  Without clubbing, cyanosis, or edema. NEUROLOGIC:  Alert and oriented x 3, normal gait, CN II-XII grossly intact.  MENTAL STATUS:  Appropriate. BACK:  Non-tender to palpation.  No CVAT SKIN:  Warm, dry and intact.   GU: Penis:  circumcised Meatus: Normal Scrotum: normal, no masses Testis: normal without masses bilateral Epididymis: normal Prostate: 40 g, NT, no nodules Rectum: Normal tone,  no masses or tenderness   Results: U/A:  11-30 WBC, few bacteria  PVR = 75 ml

## 2021-07-17 NOTE — Progress Notes (Signed)
post void residual =64m

## 2021-07-18 LAB — PSA: Prostate Specific Ag, Serum: 1.5 ng/mL (ref 0.0–4.0)

## 2021-08-21 ENCOUNTER — Encounter: Payer: Self-pay | Admitting: Urology

## 2021-08-21 ENCOUNTER — Ambulatory Visit: Payer: Medicare Other | Admitting: Urology

## 2021-08-21 VITALS — BP 133/77 | HR 76 | Ht 69.0 in | Wt 345.0 lb

## 2021-08-21 DIAGNOSIS — N401 Enlarged prostate with lower urinary tract symptoms: Secondary | ICD-10-CM

## 2021-08-21 DIAGNOSIS — N3281 Overactive bladder: Secondary | ICD-10-CM | POA: Diagnosis not present

## 2021-08-21 DIAGNOSIS — N138 Other obstructive and reflux uropathy: Secondary | ICD-10-CM

## 2021-08-21 DIAGNOSIS — R351 Nocturia: Secondary | ICD-10-CM | POA: Diagnosis not present

## 2021-08-21 DIAGNOSIS — R350 Frequency of micturition: Secondary | ICD-10-CM

## 2021-08-21 LAB — MICROSCOPIC EXAMINATION
Epithelial Cells (non renal): NONE SEEN /hpf (ref 0–10)
RBC, Urine: NONE SEEN /hpf (ref 0–2)
Renal Epithel, UA: NONE SEEN /hpf

## 2021-08-21 LAB — URINALYSIS, ROUTINE W REFLEX MICROSCOPIC
Bilirubin, UA: NEGATIVE
Glucose, UA: NEGATIVE
Nitrite, UA: NEGATIVE
RBC, UA: NEGATIVE
Specific Gravity, UA: 1.025 (ref 1.005–1.030)
Urobilinogen, Ur: 1 mg/dL (ref 0.2–1.0)
pH, UA: 6 (ref 5.0–7.5)

## 2021-08-21 LAB — BLADDER SCAN AMB NON-IMAGING: Scan Result: 3

## 2021-08-21 MED ORDER — GEMTESA 75 MG PO TABS: 75.0000 mg | ORAL_TABLET | Freq: Every day | ORAL | 0 refills | Status: DC

## 2021-08-21 NOTE — Progress Notes (Signed)
post void residual =3mL 

## 2021-08-21 NOTE — Progress Notes (Signed)
Assessment: 1. Frequent urination   2. Nocturia   3. OAB (overactive bladder)   4. BPH with obstruction/lower urinary tract symptoms     Plan: Continue bladder diet  Continue tamsulosin 0.4 mg daily. Trial of Gemtesa 75 mg daily.  Samples provided.  Use and side effects discussed. Call with results of medication in 3-4 weeks Information on PTNS provided  Chief Complaint:  Chief Complaint  Patient presents with   Urinary Frequency    History of Present Illness:  Edgar Perez is a 71 y.o. year old male who is seen for further evaluation of lower urinary tract symptoms.  At his initial visit in 6/23, he reported symptoms for approximately 1 year with urinary frequency, voiding every hour, urgency, and nocturia 3-4 times.  He also noted occasional urge incontinence.  He was voiding with a good stream.  He had some intermittency.  No dysuria or gross hematuria. IPSS = 32 QOL = 5/5.  He has previously seen Dr. Johnney Ou with [facility] in [facility].  He was originally managed with tamsulosin.  He reports trying other medications but was not aware of the names.  He was recently placed on Vesicare but did not see any improvement in his symptoms.  He was not taking any medication for his urinary symptoms. 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  Further treatment with intravesical Botox was discussed.  PSA 6/23:  1.5 He was given a trial of Myrbetriq 50 mg daily and restarted on tamsulosin 0.4 mg daily in 6/23.  He returns today for follow-up.  He did not see any change in his symptoms with Myrbetriq 50 mg daily.  He continues on tamsulosin.  He continues with frequency, urgency, nocturia x4, and urge incontinence.  No dysuria or gross hematuria. IPSS = 31 today.  Portions of the above documentation were copied from a prior visit for review purposes only.    Past Medical History:  Past Medical History:  Diagnosis Date    Allergy    Arthritis    COPD (chronic obstructive pulmonary disease) ([place])    Depression    does not take any medications   Folliculitis    GERD (gastroesophageal reflux disease)    Seasonal allergies    Substance abuse ([place])    recovering addict    Past Surgical History:  Past Surgical History:  Procedure Laterality Date   COLONOSCOPY     2012   FINGER SURGERY     POLYPECTOMY     2012   SINUSOTOMY     left side twice   TONSILLECTOMY     VASECTOMY      Allergies:  Allergies  Allergen Reactions   Statins Other (See Comments)    Muscle aches/bruising/difficulty walking **History: Atorvastatin, Pravastatin, Rosuvastatin, and Simvastatin**   Codeine     REACTION: nausea and vomiting    Family History:  Family History  Problem Relation Age of Onset   Stomach cancer Mother    Cancer Mother        gastric   Esophageal cancer Brother    Cancer Brother        esophageal   Heart disease Brother 67       MI   Alcohol abuse Father    Colon cancer Neg Hx    Rectal cancer Neg Hx     Social History:  Social History   Tobacco Use   Smoking status: Former    Types: Cigarettes  Quit date: 02/10/2007    Years since quitting: 14.5   Smokeless tobacco: Never  Substance Use Topics   Alcohol use: No   Drug use: No    ROS: Constitutional:  Negative for fever, chills, weight loss CV: Negative for chest pain, previous MI, hypertension Respiratory:  Negative for shortness of breath, wheezing, sleep apnea, frequent cough GI:  Negative for nausea, vomiting, bloody stool, GERD  Physical exam: BP 133/77   Pulse 76   Ht '5\' 9"'$  (1.753 m)   Wt (!) 345 lb (156.5 kg)   BMI 50.95 kg/m  GENERAL APPEARANCE:  Well appearing, well developed, well nourished, NAD HEENT:  Atraumatic, normocephalic, oropharynx clear NECK:  Supple without lymphadenopathy or thyromegaly ABDOMEN:  Soft, non-tender, no masses EXTREMITIES:  Moves all extremities well, without clubbing, cyanosis, or  edema NEUROLOGIC:  Alert and oriented x 3, normal gait, CN II-XII grossly intact MENTAL STATUS:  appropriate BACK:  Non-tender to palpation, No CVAT SKIN:  Warm, dry, and intact   Results: U/A: 6-10 WBC, moderate bacteria  PVR:  3 ml

## 2021-09-17 ENCOUNTER — Other Ambulatory Visit: Payer: Self-pay | Admitting: Urology

## 2021-09-17 ENCOUNTER — Telehealth: Payer: Self-pay

## 2021-09-17 DIAGNOSIS — R350 Frequency of micturition: Secondary | ICD-10-CM

## 2021-09-17 DIAGNOSIS — N3281 Overactive bladder: Secondary | ICD-10-CM

## 2021-09-17 MED ORDER — TROSPIUM CHLORIDE 20 MG PO TABS: 20.0000 mg | ORAL_TABLET | Freq: Two times a day (BID) | ORAL | 11 refills | Status: DC

## 2021-09-17 MED ORDER — GEMTESA 75 MG PO TABS: 75.0000 mg | ORAL_TABLET | Freq: Every day | ORAL | 11 refills | Status: DC

## 2021-09-17 NOTE — Telephone Encounter (Signed)
Made patient aware that Dr. Felipa Eth sent in a different medication called Trospium. Patient voiced understanding.

## 2021-09-17 NOTE — Telephone Encounter (Signed)
Patient called advising that Patient advised the Gemtesa 75 mg were working great and wanted to know if a prescriptions could be sent into pharmacy below.   Pharmacy: [facility] [address], [place] [address]

## 2021-09-17 NOTE — Telephone Encounter (Signed)
Made patient aware that prescription was sent in to pharmacy for [facility] and to follow up with Dr. Felipa Eth in 4 to 6 weeks. Patient voice understanding.

## 2021-09-17 NOTE — Telephone Encounter (Signed)
Patient states he can not afford the [facility] and I advise him I would speak with Dr. Felipa Eth about other alternative. Patient voice understanding

## 2021-09-17 NOTE — Telephone Encounter (Signed)
Please advised  

## 2021-11-05 ENCOUNTER — Ambulatory Visit: Payer: Medicare Other | Admitting: Urology

## 2021-11-05 ENCOUNTER — Encounter: Payer: Self-pay | Admitting: Urology

## 2021-11-05 VITALS — BP 100/67 | HR 101 | Ht 69.0 in | Wt 345.0 lb

## 2021-11-05 DIAGNOSIS — N138 Other obstructive and reflux uropathy: Secondary | ICD-10-CM

## 2021-11-05 DIAGNOSIS — N3281 Overactive bladder: Secondary | ICD-10-CM

## 2021-11-05 DIAGNOSIS — R350 Frequency of micturition: Secondary | ICD-10-CM

## 2021-11-05 DIAGNOSIS — N401 Enlarged prostate with lower urinary tract symptoms: Secondary | ICD-10-CM

## 2021-11-05 DIAGNOSIS — R351 Nocturia: Secondary | ICD-10-CM

## 2021-11-05 LAB — URINALYSIS, ROUTINE W REFLEX MICROSCOPIC
Bilirubin, UA: NEGATIVE
Glucose, UA: NEGATIVE
Ketones, UA: NEGATIVE
Leukocytes,UA: NEGATIVE
Nitrite, UA: NEGATIVE
Protein,UA: NEGATIVE
RBC, UA: NEGATIVE
Specific Gravity, UA: 1.015 (ref 1.005–1.030)
Urobilinogen, Ur: 0.2 mg/dL (ref 0.2–1.0)
pH, UA: 6 (ref 5.0–7.5)

## 2021-11-05 NOTE — Progress Notes (Signed)
Assessment: 1. OAB (overactive bladder)   2. Frequent urination   3. 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 Continue bladder diet  Continue tamsulosin 0.4 mg daily. I discussed a trial of solifenacin as well. Information on PTNS provided.   He will contact me if he wishes to proceed.  Chief Complaint:  Chief Complaint  Patient presents with   Over Active Bladder    History of Present Illness:  Edgar Perez is a 71 y.o. year old male who is seen for further evaluation of lower urinary tract symptoms.  At his initial visit in 6/23, he reported symptoms for approximately 1 year with urinary frequency, voiding every hour, urgency, and nocturia 3-4 times.  He also noted occasional urge incontinence.  He was voiding with a good stream.  He had some intermittency.  No dysuria or gross hematuria. IPSS = 32 QOL = 5/5.  He has previously seen Dr. Johnney Ou with [facility] in [facility].  He was originally managed with tamsulosin.  He reports trying other medications but was not aware of the names.  He was recently placed on Vesicare but did not see any improvement in his symptoms.  He was not taking any medication for his urinary symptoms. 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  Further treatment with intravesical Botox was discussed.  PSA 6/23:  1.5 He was given a trial of Myrbetriq 50 mg daily and restarted on tamsulosin 0.4 mg daily in 6/23. He did not see any change in his symptoms with Myrbetriq 50 mg daily.  He continued on tamsulosin.  He continued with frequency, urgency, nocturia x4, and urge incontinence.  No dysuria or gross hematuria. IPSS = 31.  PVR = 3 ml. He was given a trial of Gemtesa 75 mg daily in July 2023.  He noted excellent results with the [facility] but unfortunately this was not covered by his  insurance.  He was changed to trospium 20 mg twice daily.  He returns today for follow-up.  He continues on tamsulosin.  He is taking trospium 20 mg nightly.  He does not feel like this is controlling his symptoms adequately.  He continues with frequency, urgency, nocturia 2-3 times, sensation of incomplete emptying and decreased stream.  No dysuria or gross hematuria. IPSS = 24 today.  Portions of the above documentation were copied from a prior visit for review purposes only.  Past Medical History:  Past Medical History:  Diagnosis Date   Allergy    Arthritis    COPD (chronic obstructive pulmonary disease) ([place])    Depression    does not take any medications   Folliculitis    GERD (gastroesophageal reflux disease)    Seasonal allergies    Substance abuse ([place])    recovering addict    Past Surgical History:  Past Surgical History:  Procedure Laterality Date   COLONOSCOPY     2012   FINGER SURGERY     POLYPECTOMY     2012   SINUSOTOMY     left side twice   TONSILLECTOMY     VASECTOMY      Allergies:  Allergies  Allergen Reactions   Statins Other (See Comments)    Muscle aches/bruising/difficulty walking **History: Atorvastatin, Pravastatin, Rosuvastatin, and Simvastatin**   Codeine     REACTION: nausea and vomiting    Family History:  Family  History  Problem Relation Age of Onset   Stomach cancer Mother    Cancer Mother        gastric   Esophageal cancer Brother    Cancer Brother        esophageal   Heart disease Brother 4       MI   Alcohol abuse Father    Colon cancer Neg Hx    Rectal cancer Neg Hx     Social History:  Social History   Tobacco Use   Smoking status: Former    Types: Cigarettes    Quit date: 02/10/2007    Years since quitting: 14.7   Smokeless tobacco: Never  Substance Use Topics   Alcohol use: No   Drug use: No    ROS: Constitutional:  Negative for fever, chills, weight loss CV: Negative for chest pain, previous MI,  hypertension Respiratory:  Negative for shortness of breath, wheezing, sleep apnea, frequent cough GI:  Negative for nausea, vomiting, bloody stool, GERD  Physical exam: BP 100/67   Pulse (!) 101   Ht '5\' 9"'$  (1.753 m)   Wt (!) 345 lb (156.5 kg)   BMI 50.95 kg/m  GENERAL APPEARANCE:  Well appearing, well developed, well nourished, NAD HEENT:  Atraumatic, normocephalic, oropharynx clear NECK:  Supple without lymphadenopathy or thyromegaly ABDOMEN:  Soft, non-tender, no masses EXTREMITIES:  Moves all extremities well, without clubbing, cyanosis, or edema NEUROLOGIC:  Alert and oriented x 3, normal gait, CN II-XII grossly intact MENTAL STATUS:  appropriate BACK:  Non-tender to palpation, No CVAT SKIN:  Warm, dry, and intact   Results: U/A: dipstick negative

## 2022-03-09 ENCOUNTER — Encounter: Payer: Self-pay | Admitting: Urology

## 2022-03-09 ENCOUNTER — Telehealth: Payer: Self-pay

## 2022-03-09 ENCOUNTER — Ambulatory Visit: Payer: Medicare Other | Admitting: Urology

## 2022-03-09 VITALS — BP 150/84 | HR 92 | Ht 69.0 in | Wt 345.0 lb

## 2022-03-09 DIAGNOSIS — N401 Enlarged prostate with lower urinary tract symptoms: Secondary | ICD-10-CM | POA: Diagnosis not present

## 2022-03-09 DIAGNOSIS — R350 Frequency of micturition: Secondary | ICD-10-CM | POA: Diagnosis not present

## 2022-03-09 DIAGNOSIS — N138 Other obstructive and reflux uropathy: Secondary | ICD-10-CM

## 2022-03-09 LAB — URINALYSIS, ROUTINE W REFLEX MICROSCOPIC
Bilirubin, UA: NEGATIVE
Glucose, UA: NEGATIVE
Ketones, UA: NEGATIVE
Leukocytes,UA: NEGATIVE
Nitrite, UA: NEGATIVE
RBC, UA: NEGATIVE
Specific Gravity, UA: 1.02 (ref 1.005–1.030)
Urobilinogen, Ur: 0.2 mg/dL (ref 0.2–1.0)
pH, UA: 7 (ref 5.0–7.5)

## 2022-03-09 LAB — BLADDER SCAN AMB NON-IMAGING: Scan Result: 21

## 2022-03-09 NOTE — Progress Notes (Unsigned)
post void residual=21 °

## 2022-03-09 NOTE — Progress Notes (Unsigned)
03/09/2022 10:00 AM   Lemont Fillers [birth year] [id number]  Referring provider: Chesley Noon, MD [place],  [place] [id number]  No chief complaint on file.   HPI:  Follow-up-new patient for me-  1) BPH-frequency.  Over a year history of frequency and urgency and nocturia.  Occasional urge incontinence.  Good stream.  AUA symptom score was 32.  He has seen multiple providers and tried tamsulosin, Vesicare, Myrbetriq, tamsulosin, Gemtesa, trospium. Trospium and tamsulosin - no change.   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  They considered Botox.  His June 2023 PSA was 1.5 with a normal DRE.  PVR today 21 mL  Today, seen for the above. He c/o 24 hr frequency. AUASS = 30. No NG risk - no vision changes, numbness or tingling in extremity. Stream is adequate.   PMH: Past Medical History:  Diagnosis Date   Allergy    Arthritis    COPD (chronic obstructive pulmonary disease) ([place])    Depression    does not take any medications   Folliculitis    GERD (gastroesophageal reflux disease)    Seasonal allergies    Substance abuse ([place])    recovering addict    Surgical History: Past Surgical History:  Procedure Laterality Date   COLONOSCOPY     2012   FINGER SURGERY     POLYPECTOMY     2012   SINUSOTOMY     left side twice   TONSILLECTOMY     VASECTOMY      Home Medications:  Allergies as of 03/09/2022       Reactions   Statins Other (See Comments)   Muscle aches/bruising/difficulty walking **History: Atorvastatin, Pravastatin, Rosuvastatin, and Simvastatin**   Codeine    REACTION: nausea and vomiting        Medication List        Accurate as of March 09, 2022 10:00 AM. If you have any questions, ask your nurse or doctor.          Advair Diskus 250-50 MCG/DOSE Aepb Generic drug: Fluticasone-Salmeterol Inhale 1 puff into the lungs 2 (two) times daily at 10 AM  and 5 PM.   aspirin 81 MG tablet Take 1 tablet (81 mg total) by mouth daily. STOP TAKING AND DO NOT RESTART THIS OR ANY OTHER ANTI-INFLAMMATORIES UNTIL May 26, 2010   Combivent 18-103 MCG/ACT inhaler Generic drug: albuterol-ipratropium Inhale 2 puffs into the lungs 2 (two) times daily as needed.   glucosamine-chondroitin 500-400 MG tablet Take 2 tablets by mouth daily.   multivitamin tablet Take 1 tablet by mouth daily.   omeprazole 20 MG capsule Commonly known as: PRILOSEC Take 20 mg by mouth daily.   tamsulosin 0.4 MG Caps capsule Commonly known as: FLOMAX Take 1 capsule (0.4 mg total) by mouth daily.   triamcinolone cream 0.1 % Commonly known as: KENALOG APPLY  TOPICALLY TWICE DAILY   trospium 20 MG tablet Commonly known as: SANCTURA Take 1 tablet (20 mg total) by mouth 2 (two) times daily.   vardenafil 20 MG tablet Commonly known as: LEVITRA Take 1 tablet (20 mg total) by mouth daily as needed for erectile dysfunction.        Allergies:  Allergies  Allergen Reactions   Statins Other (See Comments)    Muscle aches/bruising/difficulty walking **History: Atorvastatin, Pravastatin, Rosuvastatin, and Simvastatin**   Codeine     REACTION: nausea and vomiting    Family History: Family  History  Problem Relation Age of Onset   Stomach cancer Mother    Cancer Mother        gastric   Esophageal cancer Brother    Cancer Brother        esophageal   Heart disease Brother 75       MI   Alcohol abuse Father    Colon cancer Neg Hx    Rectal cancer Neg Hx     Social History:  reports that he quit smoking about 15 years ago. He has never used smokeless tobacco. He reports that he does not drink alcohol and does not use drugs.   Physical Exam: BP (!) 150/84   Pulse 92   Ht '5\' 9"'$  (1.753 m)   Wt (!) 345 lb (156.5 kg)   BMI 50.95 kg/m   Constitutional:  Alert and oriented, No acute distress. HEENT: [place] AT, moist mucus membranes.  Trachea midline, no  masses. Cardiovascular: No clubbing, cyanosis, or edema. Respiratory: Normal respiratory effort, no increased work of breathing. GI: Abdomen is soft, nontender, nondistended, no abdominal masses GU: No CVA tenderness Skin: No rashes, bruises or suspicious lesions. Neurologic: Grossly intact, no focal deficits, moving all 4 extremities. Psychiatric: Normal mood and affect.  Laboratory Data: Lab Results  Component Value Date   WBC 10.0 03/18/2011   HGB 14.4 03/18/2011   HCT 42.0 03/18/2011   MCV 92.6 03/18/2011   PLT 223.0 03/18/2011    Lab Results  Component Value Date   CREATININE 0.9 03/18/2011    Lab Results  Component Value Date   PSA 0.98 03/18/2011   PSA 1.04 02/26/2010    No results found for: "TESTOSTERONE"  No results found for: "HGBA1C"  Urinalysis    Component Value Date/Time   COLORURINE yellow 02/26/2010 0830   APPEARANCEUR Clear 11/05/2021 0937   LABSPEC 1.015 02/26/2010 0830   PHURINE 6.5 02/26/2010 0830   GLUCOSEU Negative 11/05/2021 0937   HGBUR negative 02/26/2010 0830   BILIRUBINUR Negative 11/05/2021 0937   PROTEINUR Negative 11/05/2021 0937   UROBILINOGEN 0.2 03/18/2011 1018   UROBILINOGEN 0.2 02/26/2010 0830   NITRITE Negative 11/05/2021 0937   NITRITE negative 02/26/2010 0830   LEUKOCYTESUR Negative 11/05/2021 0937    Lab Results  Component Value Date   LABMICR Comment 11/05/2021   WBCUA 6-10 (A) 08/21/2021   LABEPIT None seen 08/21/2021   MUCUS Present 08/21/2021   BACTERIA Moderate (A) 08/21/2021    Pertinent Imaging: N/a   Assessment & Plan:    1. Frequent urination - combination - trial of trospium + Gemtesa combo. Get a prostate [place] and f/u for cystoscopy.  Also consider daily tadalafil, PTNS or botox.   - Urinalysis, Routine w reflex microscopic - BLADDER SCAN AMB NON-IMAGING   No follow-ups on file.  Festus Aloe, MD  [facility]  [address] [place], [place] [address] [phone]

## 2022-03-09 NOTE — Telephone Encounter (Signed)
Patient called to inform you he did not have any Tamsulosin at home like he thought, he has Trospium.  Patient would like to know if he needs to continue the Trospium and get a prescription for the Flomax. Please advise.

## 2022-03-10 NOTE — Telephone Encounter (Signed)
Patient states that he could already see improvement in is nocturia.  He will continue the rx as directed.

## 2022-03-23 ENCOUNTER — Ambulatory Visit (HOSPITAL_COMMUNITY)
Admission: RE | Admit: 2022-03-23 | Discharge: 2022-03-23 | Disposition: A | Discharge status: Home / Self Care | Payer: Medicare Other | Source: Ambulatory Visit | Attending: Urology | Admitting: Urology

## 2022-03-23 DIAGNOSIS — R350 Frequency of micturition: Secondary | ICD-10-CM

## 2022-03-23 DIAGNOSIS — N138 Other obstructive and reflux uropathy: Secondary | ICD-10-CM | POA: Insufficient documentation

## 2022-03-23 DIAGNOSIS — N401 Enlarged prostate with lower urinary tract symptoms: Secondary | ICD-10-CM | POA: Diagnosis present

## 2022-03-30 ENCOUNTER — Telehealth: Payer: Self-pay

## 2022-03-30 NOTE — Telephone Encounter (Signed)
Patient is aware that his tamsulosin has 11 refill that was written in June of 2023 and he need to call the pharmacy to get prescription refilled. Patient voiced understanding

## 2022-03-30 NOTE — Telephone Encounter (Signed)
Patient called and advised they needed a refill on medication below.   Medication: tamsulosin (FLOMAX) 0.4 MG CAPS capsule    Pharmacy: [facility] [address], [place] [id number]    Thank you

## 2022-04-13 ENCOUNTER — Telehealth: Payer: Self-pay

## 2022-04-13 NOTE — Telephone Encounter (Signed)
Patient called advising he needed more samples of Gemtesa75 mg. Patient advised he would come by and pick up tomorrow.

## 2022-04-14 ENCOUNTER — Telehealth: Payer: Self-pay

## 2022-04-14 NOTE — Telephone Encounter (Signed)
Patient came by the office and advised he would like a copy of the results from his recent US. We are unable to print the results for the patient until released. I would be happy to mail them once released.    Thank you

## 2022-04-27 ENCOUNTER — Other Ambulatory Visit: Payer: Medicare Other | Admitting: Urology

## 2022-05-11 ENCOUNTER — Ambulatory Visit: Payer: Medicare Other | Admitting: Urology

## 2022-05-11 ENCOUNTER — Other Ambulatory Visit: Payer: Medicare Other | Admitting: Urology

## 2022-05-11 VITALS — BP 96/67 | HR 92

## 2022-05-11 DIAGNOSIS — R350 Frequency of micturition: Secondary | ICD-10-CM

## 2022-05-11 DIAGNOSIS — N401 Enlarged prostate with lower urinary tract symptoms: Secondary | ICD-10-CM | POA: Diagnosis not present

## 2022-05-11 LAB — URINALYSIS, ROUTINE W REFLEX MICROSCOPIC
Bilirubin, UA: NEGATIVE
Glucose, UA: NEGATIVE
Ketones, UA: NEGATIVE
Leukocytes,UA: NEGATIVE
Nitrite, UA: NEGATIVE
Protein,UA: NEGATIVE
RBC, UA: NEGATIVE
Specific Gravity, UA: 1.02 (ref 1.005–1.030)
Urobilinogen, Ur: 0.2 mg/dL (ref 0.2–1.0)
pH, UA: 7 (ref 5.0–7.5)

## 2022-05-11 MED ORDER — CIPROFLOXACIN HCL 500 MG PO TABS
500.0000 mg | ORAL_TABLET | Freq: Once | ORAL | Status: AC
  Administered 2022-05-11: 500 mg via ORAL

## 2022-05-11 MED ORDER — CIPROFLOXACIN HCL 500 MG PO TABS: 500.0000 mg | ORAL_TABLET | Freq: Once | ORAL | Status: DC

## 2022-05-11 MED ORDER — GEMTESA 75 MG PO TABS: 1.0000 | ORAL_TABLET | Freq: Every day | ORAL | 11 refills | Status: AC

## 2022-05-11 NOTE — Progress Notes (Unsigned)
  [place]  05/11/22  CC: No chief complaint on file.   HPI:  1) BPH-frequency. Long history of frequency and urgency and nocturia.  Occasional urge incontinence.  Good stream.  AUA symptom score was 32.  He has seen multiple providers and tried tamsulosin, Vesicare, Myrbetriq, tamsulosin, Gemtesa, trospium. Trospium and tamsulosin - no change.    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  They considered Botox.   His June 2023 PSA was 1.5 with a normal DRE.  PVR today 21 mL   He c/o 24 hr frequency. AUASS = 30. No NG risk - no vision changes, numbness or tingling in extremity. Stream is adequate.   Today, seen for the above. His prostate [place] was 45 g. 4.4 cm length, 4.9 cm width. Cysto today mild BPH, borderline obstruction. He c/o stream good at times but frequency urgency.  We started him on trospium and Gemtesa combo. This has helped. Noc x 3.   On further questioning, he has been told he has sleep apnea.  He has been told he needed a sleep study and might have to "sleep with all that stuff on your face".  UA negative.   Blood pressure 96/67, pulse 92. NED. A&Ox3.   No respiratory distress   Abd soft, NT, ND Normal phallus with bilateral descended testicles  Cystoscopy Procedure Note  Patient identification was confirmed, informed consent was obtained, and patient was prepped using Betadine solution.  Lidocaine jelly was administered per urethral meatus.     Pre-Procedure: - Inspection reveals a normal caliber ureteral meatus.  Procedure: The flexible cystoscope was introduced without difficulty - No urethral strictures/lesions are present. -Borderline obstruction, mild prostate enlargement, no median lobe -Normal bladder neck - Bilateral ureteral orifices identified - Bladder mucosa  reveals no ulcers, tumors, or lesions - No bladder stones -Mild trabeculation  Retroflexion shows normal bladder and bladder  neck   Post-Procedure: - Patient tolerated the procedure well  Assessment/ Plan:  BPH-again discussed the nature risk and benefits of medications and procedures.  We did discuss risk of the procedure making his frequency and urgency and incontinence worse.  We also discussed PTNS.  He will continue Gemtesa, trospium and tamsulosin.  I sent a prescription for Gemtesa.  I gave him a few more samples.  We also discussed the link between sleep apnea and nocturia and I recommended he follow-up with Dr. Melford Aase for the sleep study.   Festus Aloe, MD

## 2022-08-10 ENCOUNTER — Encounter: Payer: Self-pay | Admitting: Urology

## 2022-08-10 ENCOUNTER — Ambulatory Visit: Payer: Medicare Other | Admitting: Urology

## 2022-08-10 VITALS — BP 74/53 | HR 102

## 2022-08-10 DIAGNOSIS — N401 Enlarged prostate with lower urinary tract symptoms: Secondary | ICD-10-CM

## 2022-08-10 DIAGNOSIS — N3281 Overactive bladder: Secondary | ICD-10-CM | POA: Diagnosis not present

## 2022-08-10 DIAGNOSIS — R350 Frequency of micturition: Secondary | ICD-10-CM

## 2022-08-10 DIAGNOSIS — R351 Nocturia: Secondary | ICD-10-CM | POA: Diagnosis not present

## 2022-08-10 DIAGNOSIS — N138 Other obstructive and reflux uropathy: Secondary | ICD-10-CM

## 2022-08-10 LAB — MICROSCOPIC EXAMINATION: Bacteria, UA: NONE SEEN

## 2022-08-10 LAB — URINALYSIS, ROUTINE W REFLEX MICROSCOPIC
Bilirubin, UA: NEGATIVE
Glucose, UA: NEGATIVE
Leukocytes,UA: NEGATIVE
Nitrite, UA: NEGATIVE
RBC, UA: NEGATIVE
Specific Gravity, UA: 1.015 (ref 1.005–1.030)
Urobilinogen, Ur: 1 mg/dL (ref 0.2–1.0)
pH, UA: 7.5 (ref 5.0–7.5)

## 2022-08-10 LAB — BLADDER SCAN AMB NON-IMAGING: Scan Result: 0

## 2022-08-10 MED ORDER — TAMSULOSIN HCL 0.4 MG PO CAPS: 0.4000 mg | ORAL_CAPSULE | Freq: Every day | ORAL | 3 refills | Status: AC

## 2022-08-10 NOTE — Progress Notes (Signed)
08/10/2022 9:14 AM   Edgar Perez 1951-01-14 [id number]  Referring provider: Eartha Inch, MD [address] [place],  [place] [address]  No chief complaint on file.   HPI:  F/u -   1) BPH-frequency. Long history of frequency and urgency and nocturia.  Occasional urge incontinence.  Good stream.  AUA symptom score was 32.  He has seen multiple providers and tried tamsulosin, Vesicare, Myrbetriq, tamsulosin, Gemtesa, trospium. Trospium and tamsulosin - no change.    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  They considered Botox.   His June 2023 PSA was 1.5 with a normal DRE.  PVR 21 mL   He c/o 24 hr frequency. AUASS = 30. No NG risk - no vision changes, numbness or tingling in extremity. Stream is adequate. He has been told he has sleep apnea.  He has been told he needed a sleep study and might have to "sleep with all that stuff on your face". Discussed link between OSA and nocturia and I rec to f/u with PCP for sleep study.    His Jun 2023 PSA was 1.5. His prostate [place] was 45 g. 4.4 cm length, 4.9 cm width. Cysto APR 2024 mild BPH, borderline obstruction. He c/o stream good at times but frequency urgency.   Today, seen for the above. We started him on trospium and Gemtesa combo. Continued tamsulosin and Gemtesa. He might take gemtesa every other day. This has helped. Noc x 3. He stopped trospium. Still doing well with noc x 2. No dysuria or gross hematuria.    PMH: Past Medical History:  Diagnosis Date   Allergy    Arthritis    COPD (chronic obstructive pulmonary disease) ([facility])    Depression    does not take any medications   Folliculitis    GERD (gastroesophageal reflux disease)    Seasonal allergies    Substance abuse ([facility])    recovering addict    Surgical History: Past Surgical History:  Procedure Laterality Date   COLONOSCOPY     2012   FINGER SURGERY     POLYPECTOMY     2012    SINUSOTOMY     left side twice   TONSILLECTOMY     VASECTOMY      Home Medications:  Allergies as of 08/10/2022       Reactions   Statins Other (See Comments)   Muscle aches/bruising/difficulty walking **History: Atorvastatin, Pravastatin, Rosuvastatin, and Simvastatin**   Codeine    REACTION: nausea and vomiting        Medication List        Accurate as of August 10, 2022  9:14 AM. If you have any questions, ask your nurse or doctor.          Advair Diskus 250-50 MCG/DOSE Aepb Generic drug: Fluticasone-Salmeterol Inhale 1 puff into the lungs 2 (two) times daily at 10 AM and 5 PM.   aspirin 81 MG tablet Take 1 tablet (81 mg total) by mouth daily. STOP TAKING AND DO NOT RESTART THIS OR ANY OTHER ANTI-INFLAMMATORIES UNTIL May 26, 2010   Combivent 18-103 MCG/ACT inhaler Generic drug: albuterol-ipratropium Inhale 2 puffs into the lungs 2 (two) times daily as needed.   Gemtesa 75 MG Tabs Generic drug: Vibegron Take 1 tablet (75 mg total) by mouth daily.   glucosamine-chondroitin 500-400 MG tablet Take 2 tablets by mouth daily.   multivitamin tablet Take 1 tablet by mouth daily.  omeprazole 20 MG capsule Commonly known as: PRILOSEC Take 20 mg by mouth daily.   tamsulosin 0.4 MG Caps capsule Commonly known as: FLOMAX Take 1 capsule (0.4 mg total) by mouth daily.   triamcinolone cream 0.1 % Commonly known as: KENALOG APPLY  TOPICALLY TWICE DAILY   trospium 20 MG tablet Commonly known as: SANCTURA Take 1 tablet (20 mg total) by mouth 2 (two) times daily.   vardenafil 20 MG tablet Commonly known as: LEVITRA Take 1 tablet (20 mg total) by mouth daily as needed for erectile dysfunction.        Allergies:  Allergies  Allergen Reactions   Statins Other (See Comments)    Muscle aches/bruising/difficulty walking **History: Atorvastatin, Pravastatin, Rosuvastatin, and Simvastatin**   Codeine     REACTION: nausea and vomiting    Family History: Family  History  Problem Relation Age of Onset   Stomach cancer Mother    Cancer Mother        gastric   Esophageal cancer Brother    Cancer Brother        esophageal   Heart disease Brother 21       MI   Alcohol abuse Father    Colon cancer Neg Hx    Rectal cancer Neg Hx     Social History:  reports that he quit smoking about 15 years ago. He has never used smokeless tobacco. He reports that he does not drink alcohol and does not use drugs.   Physical Exam: There were no vitals taken for this visit.  Constitutional:  Alert and oriented, No acute distress. HEENT: [place] AT, moist mucus membranes.  Trachea midline, no masses. Cardiovascular: No clubbing, cyanosis, or edema. Respiratory: Normal respiratory effort, no increased work of breathing. GI: Abdomen is soft, nontender, nondistended, no abdominal masses GU: No CVA tenderness Lymph: No cervical or inguinal lymphadenopathy. Skin: No rashes, bruises or suspicious lesions. Neurologic: Grossly intact, no focal deficits, moving all 4 extremities. Psychiatric: Normal mood and affect.  Laboratory Data: Lab Results  Component Value Date   WBC 10.0 03/18/2011   HGB 14.4 03/18/2011   HCT 42.0 03/18/2011   MCV 92.6 03/18/2011   PLT 223.0 03/18/2011    Lab Results  Component Value Date   CREATININE 0.9 03/18/2011    Lab Results  Component Value Date   PSA 0.98 03/18/2011   PSA 1.04 02/26/2010    No results found for: "TESTOSTERONE"  No results found for: "HGBA1C"  Urinalysis    Component Value Date/Time   COLORURINE yellow 02/26/2010 0830   APPEARANCEUR Clear 05/11/2022 1106   LABSPEC 1.015 02/26/2010 0830   PHURINE 6.5 02/26/2010 0830   GLUCOSEU Negative 05/11/2022 1106   HGBUR negative 02/26/2010 0830   BILIRUBINUR Negative 05/11/2022 1106   PROTEINUR Negative 05/11/2022 1106   UROBILINOGEN 0.2 03/18/2011 1018   UROBILINOGEN 0.2 02/26/2010 0830   NITRITE Negative 05/11/2022 1106   NITRITE negative 02/26/2010 0830    LEUKOCYTESUR Negative 05/11/2022 1106    Lab Results  Component Value Date   LABMICR Comment 05/11/2022   WBCUA 6-10 (A) 08/21/2021   LABEPIT None seen 08/21/2021   MUCUS Present 08/21/2021   BACTERIA Moderate (A) 08/21/2021    Pertinent Imaging: N/a  Assessment & Plan:    1. Frequent urination Cont tams and gemtesa . He asked about PTNS again and we discussed the nature r/b/a to it. He does have a spine stim coming up so PTNS not a good option. I refilled tams and gave  him samples of gemtesa.   2. BPH with obstruction/lower urinary tract symptoms As above   3. OAB (overactive bladder) As above  4. Nocturia As above    No follow-ups on file.  Jerilee Field, MD  [facility]  [address] [place], [place] [address] [phone]

## 2022-08-11 LAB — PSA: Prostate Specific Ag, Serum: 1.4 ng/mL (ref 0.0–4.0)
